# Patient Record
Sex: Male | Born: 1970 | Race: Black or African American | Hispanic: No | Smoking: Never smoker
Health system: Southern US, Community
[De-identification: ages and names within clinical notes are randomized; demographics above are authoritative.]

## PROBLEM LIST (undated history)

## (undated) DIAGNOSIS — I10 Essential (primary) hypertension: Secondary | ICD-10-CM

## (undated) DIAGNOSIS — R002 Palpitations: Principal | ICD-10-CM

## (undated) DIAGNOSIS — N189 Chronic kidney disease, unspecified: Secondary | ICD-10-CM

## (undated) DIAGNOSIS — E663 Overweight: Secondary | ICD-10-CM

## (undated) DIAGNOSIS — R7303 Prediabetes: Secondary | ICD-10-CM

## (undated) DIAGNOSIS — IMO0001 Reserved for inherently not codable concepts without codable children: Secondary | ICD-10-CM

## (undated) DIAGNOSIS — E785 Hyperlipidemia, unspecified: Secondary | ICD-10-CM

## (undated) DIAGNOSIS — K219 Gastro-esophageal reflux disease without esophagitis: Secondary | ICD-10-CM

## (undated) DIAGNOSIS — R7989 Other specified abnormal findings of blood chemistry: Secondary | ICD-10-CM

## (undated) DIAGNOSIS — R42 Dizziness and giddiness: Secondary | ICD-10-CM

## (undated) DIAGNOSIS — E7800 Pure hypercholesterolemia, unspecified: Secondary | ICD-10-CM

## (undated) DIAGNOSIS — F419 Anxiety disorder, unspecified: Secondary | ICD-10-CM

## (undated) DIAGNOSIS — D72819 Decreased white blood cell count, unspecified: Secondary | ICD-10-CM

## (undated) DIAGNOSIS — R030 Elevated blood-pressure reading, without diagnosis of hypertension: Secondary | ICD-10-CM

## (undated) DIAGNOSIS — R011 Cardiac murmur, unspecified: Secondary | ICD-10-CM

## (undated) DIAGNOSIS — I4891 Unspecified atrial fibrillation: Secondary | ICD-10-CM

## (undated) DIAGNOSIS — M1990 Unspecified osteoarthritis, unspecified site: Secondary | ICD-10-CM

## (undated) HISTORY — DX: Essential (primary) hypertension: I10

## (undated) HISTORY — DX: Reserved for inherently not codable concepts without codable children: IMO0001

## (undated) HISTORY — DX: Hyperlipidemia, unspecified: E78.5

## (undated) HISTORY — DX: Other specified abnormal findings of blood chemistry: R79.89

## (undated) HISTORY — DX: Elevated blood-pressure reading, without diagnosis of hypertension: R03.0

## (undated) HISTORY — DX: Pure hypercholesterolemia, unspecified: E78.00

## (undated) HISTORY — DX: Cardiac murmur, unspecified: R01.1

## (undated) HISTORY — DX: Morbid (severe) obesity due to excess calories: E66.01

## (undated) HISTORY — DX: Decreased white blood cell count, unspecified: D72.819

## (undated) HISTORY — DX: Overweight: E66.3

## (undated) HISTORY — DX: Palpitations: R00.2

---

## 1991-02-04 HISTORY — PX: WRIST SURGERY: SHX841

## 1992-02-04 HISTORY — PX: KNEE SURGERY: SHX244

## 1998-08-31 ENCOUNTER — Ambulatory Visit (HOSPITAL_COMMUNITY): Admission: RE | Admit: 1998-08-31 | Discharge: 1998-08-31 | Payer: Self-pay | Admitting: Emergency Medicine

## 2007-04-12 ENCOUNTER — Emergency Department (HOSPITAL_COMMUNITY): Admission: EM | Admit: 2007-04-12 | Discharge: 2007-04-12 | Payer: Self-pay | Admitting: Emergency Medicine

## 2007-08-09 ENCOUNTER — Encounter: Admission: RE | Admit: 2007-08-09 | Discharge: 2007-08-09 | Payer: Self-pay | Admitting: Family Medicine

## 2008-04-05 ENCOUNTER — Ambulatory Visit: Payer: Self-pay | Admitting: Diagnostic Radiology

## 2008-04-05 ENCOUNTER — Emergency Department (HOSPITAL_BASED_OUTPATIENT_CLINIC_OR_DEPARTMENT_OTHER): Admission: EM | Admit: 2008-04-05 | Discharge: 2008-04-05 | Payer: Self-pay | Admitting: Emergency Medicine

## 2008-04-28 ENCOUNTER — Emergency Department (HOSPITAL_BASED_OUTPATIENT_CLINIC_OR_DEPARTMENT_OTHER): Admission: EM | Admit: 2008-04-28 | Discharge: 2008-04-28 | Payer: Self-pay | Admitting: Emergency Medicine

## 2009-06-04 ENCOUNTER — Ambulatory Visit: Payer: Self-pay | Admitting: Internal Medicine

## 2009-06-06 LAB — COMPREHENSIVE METABOLIC PANEL
ALT: 38 U/L (ref 0–53)
AST: 23 U/L (ref 0–37)
Albumin: 4.3 g/dL (ref 3.5–5.2)
Alkaline Phosphatase: 68 U/L (ref 39–117)
BUN: 13 mg/dL (ref 6–23)
CO2: 26 mEq/L (ref 19–32)
Creatinine, Ser: 1.27 mg/dL (ref 0.40–1.50)
Glucose, Bld: 94 mg/dL (ref 70–99)

## 2009-06-06 LAB — LACTATE DEHYDROGENASE: LDH: 135 U/L (ref 94–250)

## 2009-06-06 LAB — CBC WITH DIFFERENTIAL/PLATELET
Basophils Absolute: 0 10*3/uL (ref 0.0–0.1)
EOS%: 3 % (ref 0.0–7.0)
Eosinophils Absolute: 0.1 10*3/uL (ref 0.0–0.5)
HGB: 14.4 g/dL (ref 13.0–17.1)
MONO#: 0.3 10*3/uL (ref 0.1–0.9)

## 2009-06-06 LAB — FOLATE: Folate: >20 ng/mL

## 2009-06-18 ENCOUNTER — Ambulatory Visit (HOSPITAL_COMMUNITY): Admission: RE | Admit: 2009-06-18 | Discharge: 2009-06-18 | Payer: Self-pay | Admitting: Internal Medicine

## 2009-06-21 LAB — CBC WITH DIFFERENTIAL/PLATELET
BASO%: 0.3 % (ref 0.0–2.0)
HCT: 43.9 % (ref 38.4–49.9)
HGB: 14.2 g/dL (ref 13.0–17.1)
MONO%: 8.3 % (ref 0.0–14.0)
NEUT#: 1.5 10*3/uL (ref 1.5–6.5)
Platelets: 234 10*3/uL (ref 140–400)

## 2009-06-22 LAB — HIV ANTIBODY (ROUTINE TESTING W REFLEX)

## 2009-06-22 LAB — HEPATITIS PANEL, ACUTE
HCV Ab: NEGATIVE
Hep A IgM: NEGATIVE
Hep B C IgM: NEGATIVE
Hepatitis B Surface Ag: NEGATIVE

## 2009-06-22 LAB — LACTATE DEHYDROGENASE: LDH: 138 U/L (ref 94–250)

## 2010-03-13 ENCOUNTER — Ambulatory Visit: Payer: Self-pay

## 2010-03-13 ENCOUNTER — Other Ambulatory Visit: Payer: Self-pay | Admitting: Occupational Medicine

## 2010-03-13 DIAGNOSIS — R52 Pain, unspecified: Secondary | ICD-10-CM

## 2010-04-22 LAB — CBC
MCV: 75.9 fL — ABNORMAL LOW (ref 78.0–100.0)
Platelets: 237 10*3/uL (ref 150–400)
RBC: 5.77 MIL/uL (ref 4.22–5.81)
RDW: 14.1 % (ref 11.5–15.5)
WBC: 3.8 10*3/uL — ABNORMAL LOW (ref 4.0–10.5)

## 2010-04-22 LAB — PROTIME-INR
INR: 1 (ref 0.00–1.49)
Prothrombin Time: 13.1 seconds (ref 11.6–15.2)

## 2010-04-22 LAB — BONE MARROW EXAM

## 2010-04-22 LAB — APTT: aPTT: 26 seconds (ref 24–37)

## 2010-04-22 LAB — CHROMOSOME ANALYSIS, BONE MARROW

## 2010-05-16 LAB — CBC
HCT: 45.9 % (ref 39.0–52.0)
Hemoglobin: 14.6 g/dL (ref 13.0–17.0)
MCHC: 31.7 g/dL (ref 30.0–36.0)
MCHC: 31.3 g/dL (ref 30.0–36.0)
MCV: 75.5 fL — ABNORMAL LOW (ref 78.0–100.0)
Platelets: 213 10*3/uL (ref 150–400)
RBC: 6.1 MIL/uL — ABNORMAL HIGH (ref 4.22–5.81)
RDW: 13.5 % (ref 11.5–15.5)

## 2010-05-16 LAB — URINALYSIS, ROUTINE W REFLEX MICROSCOPIC
Nitrite: NEGATIVE
Protein, ur: 100 mg/dL — AB
Specific Gravity, Urine: 1.021 (ref 1.005–1.030)
Urobilinogen, UA: 0.2 mg/dL (ref 0.0–1.0)

## 2010-05-16 LAB — DIFFERENTIAL
Basophils Absolute: 0 10*3/uL (ref 0.0–0.1)
Eosinophils Absolute: 0.1 10*3/uL (ref 0.0–0.7)
Eosinophils Relative: 2 % (ref 0–5)
Lymphs Abs: 1.3 10*3/uL (ref 0.7–4.0)
Lymphs Abs: 1.4 10*3/uL (ref 0.7–4.0)
Monocytes Relative: 9 % (ref 3–12)
Monocytes Relative: 7 % (ref 3–12)
Neutro Abs: 2 10*3/uL (ref 1.7–7.7)

## 2010-05-16 LAB — COMPREHENSIVE METABOLIC PANEL
AST: 46 U/L — ABNORMAL HIGH (ref 0–37)
Albumin: 4.1 g/dL (ref 3.5–5.2)
CO2: 28 mEq/L (ref 19–32)
Calcium: 9 mg/dL (ref 8.4–10.5)
Chloride: 102 mEq/L (ref 96–112)
GFR calc Af Amer: >60 mL/min (ref >60)
Glucose, Bld: 108 mg/dL — ABNORMAL HIGH (ref 70–99)
Potassium: 3.8 mEq/L (ref 3.5–5.1)
Sodium: 141 mEq/L (ref 135–145)
Total Protein: 7.7 g/dL (ref 6.0–8.3)

## 2010-05-16 LAB — BASIC METABOLIC PANEL
BUN: 13 mg/dL (ref 6–23)
Creatinine, Ser: 1.3 mg/dL (ref 0.4–1.5)
GFR calc non Af Amer: >60 mL/min (ref >60)
Glucose, Bld: 132 mg/dL — ABNORMAL HIGH (ref 70–99)
Potassium: 4 mEq/L (ref 3.5–5.1)
Sodium: 144 mEq/L (ref 135–145)

## 2010-05-16 LAB — GLUCOSE, CAPILLARY: Glucose-Capillary: 130 mg/dL — ABNORMAL HIGH (ref 70–99)

## 2010-05-16 LAB — POCT CARDIAC MARKERS: Myoglobin, poc: 107 ng/mL (ref 12–200)

## 2010-05-16 LAB — HEMOCCULT GUIAC POC 1CARD (OFFICE): Fecal Occult Bld: NEGATIVE

## 2010-05-16 LAB — LIPASE, BLOOD: Lipase: 102 U/L (ref 23–300)

## 2010-10-28 LAB — COMPREHENSIVE METABOLIC PANEL
AST: 28
CO2: 26
Chloride: 105
Creatinine, Ser: 1.23
GFR calc Af Amer: >60
GFR calc non Af Amer: >60
Total Bilirubin: 0.6

## 2010-10-28 LAB — CBC
HCT: 44.4
MCV: 74.5 — ABNORMAL LOW
RBC: 5.96 — ABNORMAL HIGH
WBC: 5

## 2010-10-28 LAB — DIFFERENTIAL
Basophils Absolute: 0.1
Basophils Relative: 2 — ABNORMAL HIGH
Eosinophils Absolute: 0.1
Eosinophils Relative: 1
Lymphocytes Relative: 31

## 2010-10-28 LAB — POCT CARDIAC MARKERS
Operator id: 5451
Troponin i, poc: <0.05

## 2011-05-11 IMAGING — CT CT BIOPSY
3 of 5 series · 17 of 32 positions shown, 22 images · non-contrast
Comparison: none

CLINICAL DATA: Leukocytopenia

CT GUIDED DEEP ILIAC BONE ASPIRATION AND CORE BIOPSY:
TECHNIQUE: Patient was placed supine on the CT gantry and limited
axial scans through the pelvis were obtained. Appropriate skin
entry site was identified. Skin site was marked, prepped with
Betadine,   draped in usual sterile fashion, and infiltrated
locally with 1% lidocaine. Intravenous fentanyl and Versed were
administered as conscious sedation during continuous
cardiorespiratory monitoring by the radiology RN, with a total
moderate sedation time of 15 minutes.

[Series 2: pelvis without · axial · non-contrast · 0.45mm/px · z∈[-401,-366]mm · 4 of 13 slices shown (1 of 2)]
[im 3/13  soft-tissue]
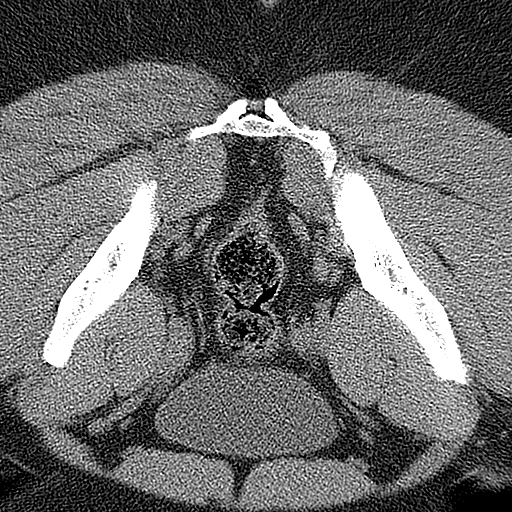
[im 5/13  soft-tissue]
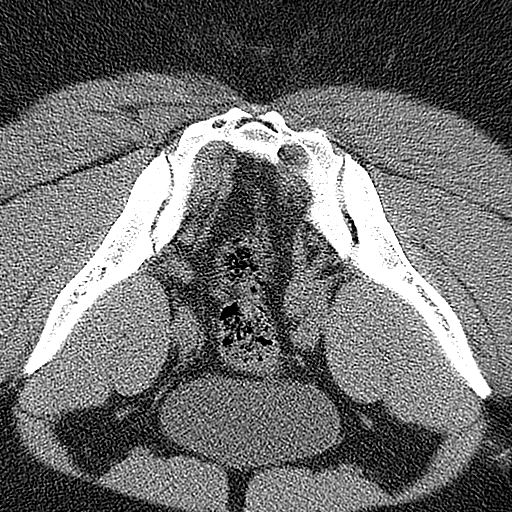
[im 8/13  soft-tissue]
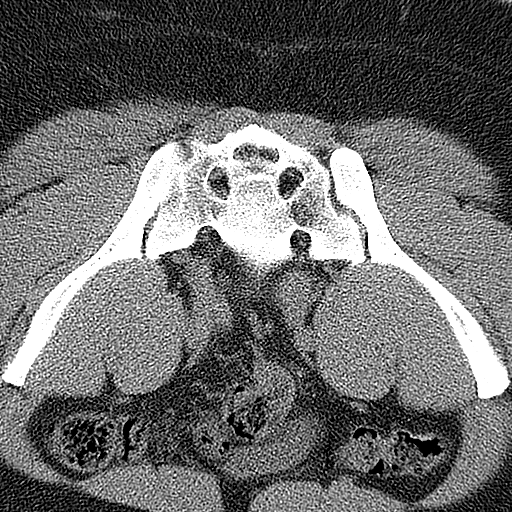
[im 10/13  soft-tissue]
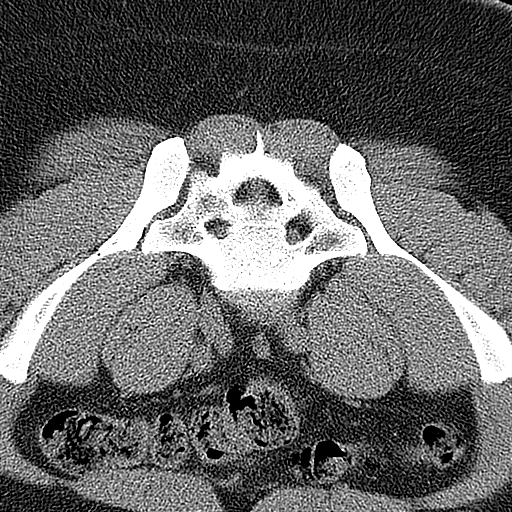

[Series 3: pelvis without · axial · non-contrast · 0.47mm/px · z∈[-401,-361]mm · 5 of 13 slices shown, 10 images (2 of 2)]
[im 3/13  soft-tissue]
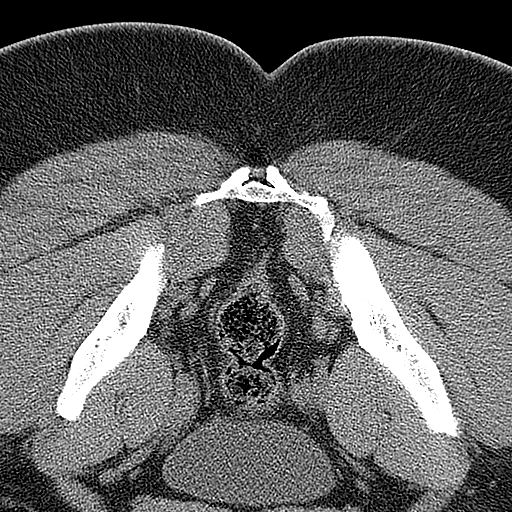
[im 3/13  bone]
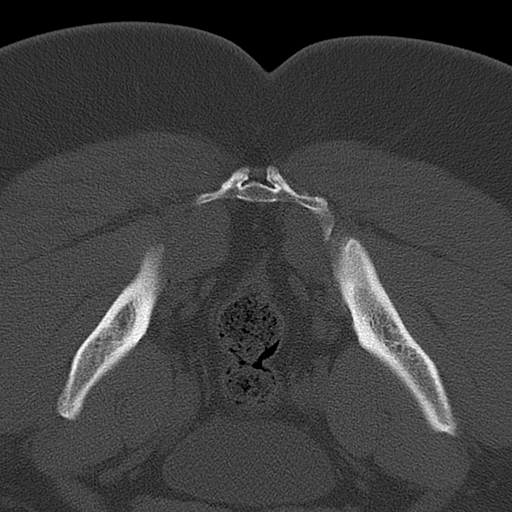
[im 5/13  soft-tissue]
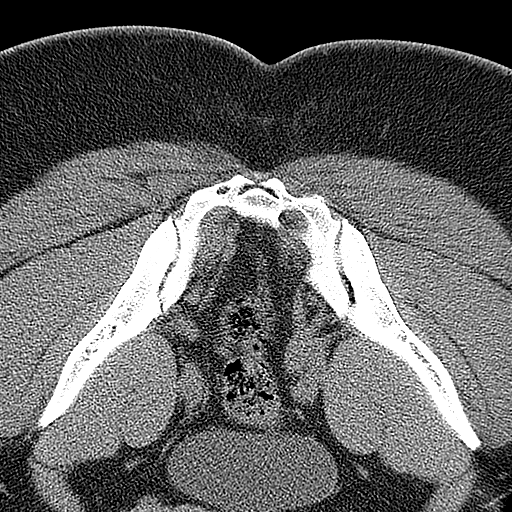
[im 5/13  lung]
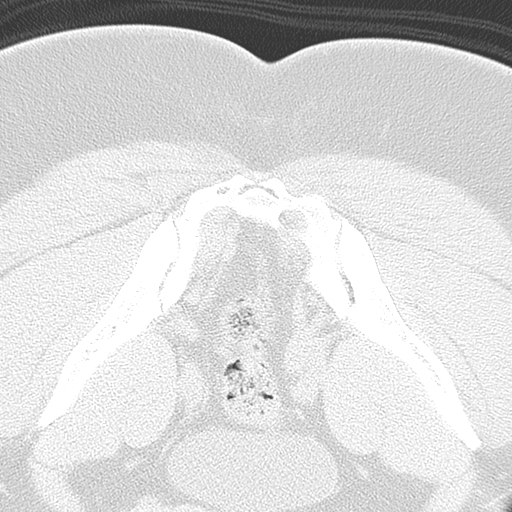
[im 7/13  soft-tissue]
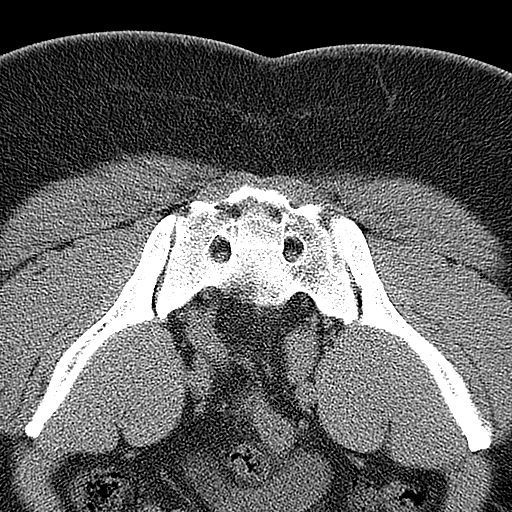
[im 7/13  lung]
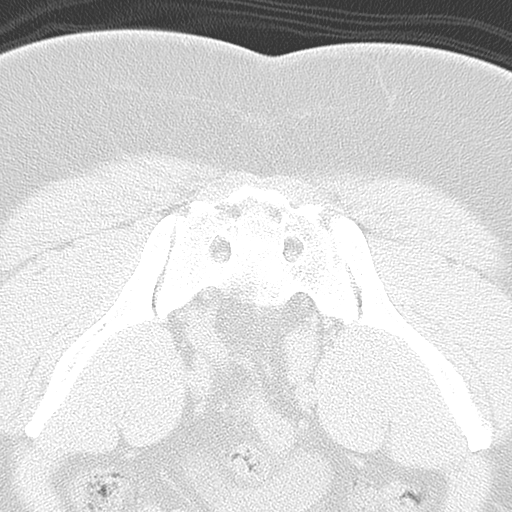
[im 9/13  soft-tissue]
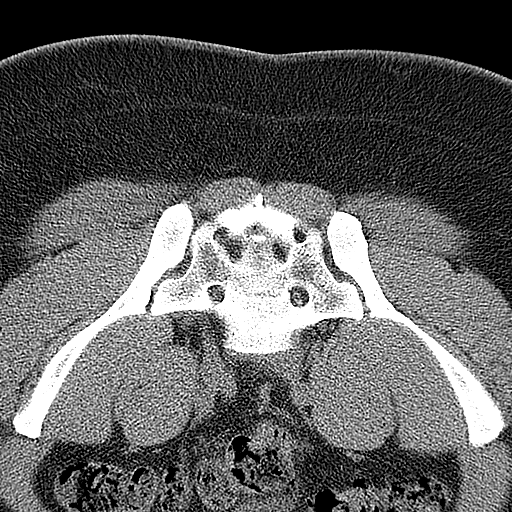
[im 9/13  lung]
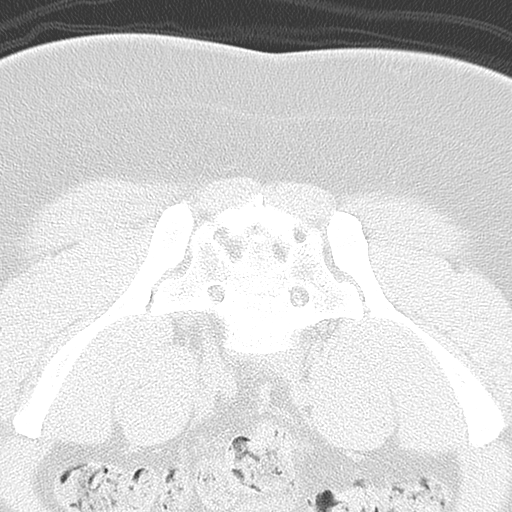
[im 11/13  soft-tissue]
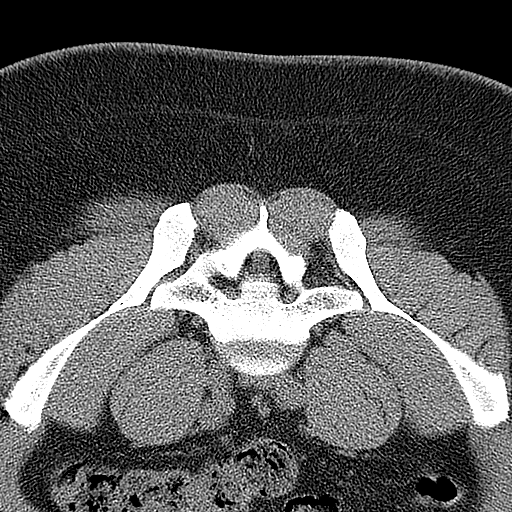
[im 11/13  lung]
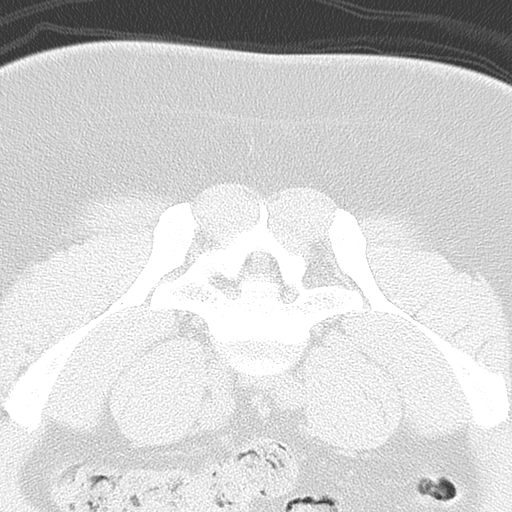

[Series 5: (hospital) 6.0 b30f · axial · 0.95mm/px · z∈[-366,-354]mm · 8 of 24 slices shown]
[im 2/24  soft-tissue]
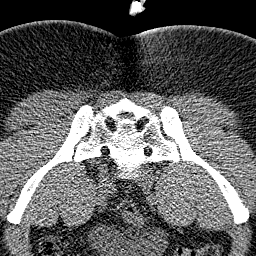
[im 6/24  soft-tissue]
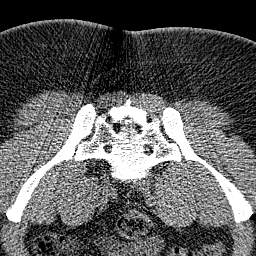
[im 8/24  soft-tissue]
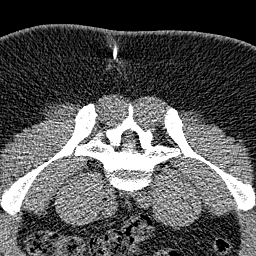
[im 10/24  soft-tissue]
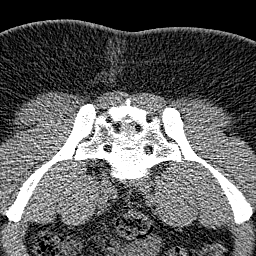
[im 14/24  soft-tissue]
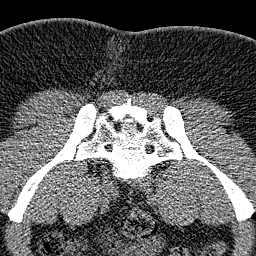
[im 16/24  soft-tissue]
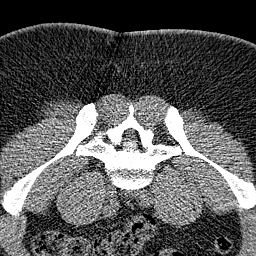
[im 18/24  soft-tissue]
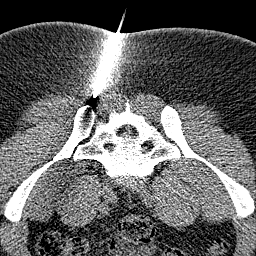
[im 22/24  soft-tissue]
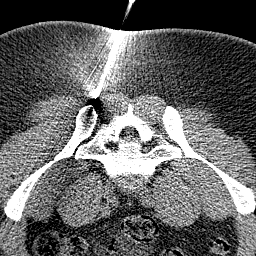

[17 of 32 positions shown; findings below may reference images not displayed]

Under CT fluoroscopic guidance an 11-gauge Cook trocar bone needle
was advanced into the right iliac bone just lateral to the
sacroiliac joint. Once needle tip position was confirmed, coaxial
core and aspiration samples were obtained. The final sample was
obtained using the guiding needle itself, which was then removed.
Postprocedure scans show no hematoma or fracture. Patient tolerated
procedure well, with no immediate complication.
IMPRESSION: 1. Technically successful CT guided right iliac bone core and
aspiration biopsy.

## 2015-03-21 ENCOUNTER — Encounter: Payer: Self-pay | Admitting: Interventional Cardiology

## 2015-03-22 ENCOUNTER — Ambulatory Visit: Payer: Self-pay | Admitting: Interventional Cardiology

## 2015-08-22 ENCOUNTER — Telehealth: Payer: Self-pay | Admitting: Cardiovascular Disease

## 2015-08-22 NOTE — Telephone Encounter (Signed)
Received records from [facility] for appointment on 09/24/15 with Dr [place].  Records given to N Hines (medical records) for Dr Lytton's schedule on 09/24/15. lp °

## 2015-09-23 NOTE — Progress Notes (Deleted)
Cardiology Office Note   Date:  09/23/2015   ID:  Robert Pingrayveawn Minckler, DOB [birth year], MRN [id number]  PCP:  No primary care provider on file.  Cardiologist:   Chilton Siiffany [place], MD   No chief complaint on file.     History of Present Illness: Robert Haney is a 45 y.o. male with hypertension and hyperlipidemia who presents for ***    Past Medical History:  Diagnosis Date  . Elevated BP    elevated BP readings-2010-------- normalized when rechecked 12/01/08  . Elevated LDL cholesterol level     (normal TSH 2008)  . Hypertension, essential   . Leukocytopenia    Mild leukocytopenia- work up including Bone Marrow bx done, normal variant- Dr. Penelope Galasmohammed, [place] [facility] 8/11  . Low testosterone    (symptomatic)--2.60 03/2008  . Overweight     Past Surgical History:  Procedure Laterality Date  . KNEE SURGERY  1994   right knee   . WRIST SURGERY  1993   left wrist      Current Outpatient Prescriptions  Medication Sig Dispense Refill  . amLODipine (NORVASC) 5 MG tablet Take 5 mg by mouth daily.    . DiphenhydrAMINE HCl (BENADRYL PO) Take by mouth. Benadryl 25 MG Capsule 1 capsule as needed    . Multiple Vitamin (MULTI-VITAMIN PO) Take by mouth. One Daily Mens Tablet 1 table t Once a day    . Ranitidine HCl (ZANTAC PO) Take by mouth. Zantac 150 MG Tablet 1 tablet every other day     No current facility-administered medications for this visit.     Allergies:   Review of patient's allergies indicates no known allergies.    Social History:  The patient  reports that he has never smoked. He does not have any smokeless tobacco history on file. He reports that he drinks alcohol. He reports that he does not use drugs.   Family History:  The patient's ***family history includes Hypertension in his father and mother.    ROS:  Please see the history of present illness.   Otherwise, review of systems are positive for {NONE DEFAULTED:18576::"none"}.   All other systems  are reviewed and negative.    PHYSICAL EXAM: VS:  There were no vitals taken for this visit. , BMI There is no height or weight on file to calculate BMI. GENERAL:  Well appearing HEENT:  Pupils equal round and reactive, fundi not visualized, oral mucosa unremarkable NECK:  No jugular venous distention, waveform within normal limits, carotid upstroke brisk and symmetric, no bruits, no thyromegaly LYMPHATICS:  No cervical adenopathy LUNGS:  Clear to auscultation bilaterally HEART:  RRR.  PMI not displaced or sustained,S1 and S2 within normal limits, no S3, no S4, no clicks, no rubs, *** murmurs ABD:  Flat, positive bowel sounds normal in frequency in pitch, no bruits, no rebound, no guarding, no midline pulsatile mass, no hepatomegaly, no splenomegaly EXT:  2 plus pulses throughout, no edema, no cyanosis no clubbing SKIN:  No rashes no nodules NEURO:  Cranial nerves II through XII grossly intact, motor grossly intact throughout PSYCH:  Cognitively intact, oriented to person place and time    EKG:  EKG {ACTION; IS/IS [id number]} ordered today. The ekg ordered today demonstrates ***   Recent Labs: No results found for requested labs within last 8760 hours.    Lipid Panel No results found for: CHOL, TRIG, HDL, CHOLHDL, VLDL, LDLCALC, LDLDIRECT    Wt Readings from Last 3 Encounters:  No data found for  Wt      ASSESSMENT AND PLAN:  ***   Current medicines are reviewed at length with the patient today.  The patient {ACTIONS; HAS/DOES NOT HAVE:19233} concerns regarding medicines.  The following changes have been made:  {PLAN; NO CHANGE:13088:s}  Labs/ tests ordered today include: *** No orders of the defined types were placed in this encounter.    Disposition:   FU with ***    This note was written with the assistance of speech recognition software.  Please excuse any transcriptional errors.  Signed, Toryn Dewalt C. Duke Salviaandolph, MD, [facility]  09/23/2015 9:43 PM    [facility]

## 2015-09-24 ENCOUNTER — Ambulatory Visit: Payer: Commercial Managed Care - HMO | Admitting: Cardiovascular Disease

## 2015-09-27 ENCOUNTER — Encounter: Payer: Self-pay | Admitting: Cardiovascular Disease

## 2015-09-27 ENCOUNTER — Ambulatory Visit (INDEPENDENT_AMBULATORY_CARE_PROVIDER_SITE_OTHER): Payer: Commercial Managed Care - HMO | Admitting: Cardiovascular Disease

## 2015-09-27 ENCOUNTER — Encounter (INDEPENDENT_AMBULATORY_CARE_PROVIDER_SITE_OTHER): Payer: Self-pay

## 2015-09-27 VITALS — BP 159/92 | HR 53 | Ht 62.5 in | Wt 234.4 lb

## 2015-09-27 DIAGNOSIS — E785 Hyperlipidemia, unspecified: Secondary | ICD-10-CM

## 2015-09-27 DIAGNOSIS — I10 Essential (primary) hypertension: Secondary | ICD-10-CM

## 2015-09-27 DIAGNOSIS — R011 Cardiac murmur, unspecified: Secondary | ICD-10-CM

## 2015-09-27 DIAGNOSIS — R002 Palpitations: Secondary | ICD-10-CM

## 2015-09-27 DIAGNOSIS — R600 Localized edema: Secondary | ICD-10-CM

## 2015-09-27 HISTORY — DX: Hyperlipidemia, unspecified: E78.5

## 2015-09-27 HISTORY — DX: Palpitations: R00.2

## 2015-09-27 HISTORY — DX: Cardiac murmur, unspecified: R01.1

## 2015-09-27 HISTORY — DX: Morbid (severe) obesity due to excess calories: E66.01

## 2015-09-27 HISTORY — DX: Essential (primary) hypertension: I10

## 2015-09-27 NOTE — Progress Notes (Signed)
Cardiology Office Note   Date:  09/27/2015   ID:  Robert Haney, DOB 1970/04/17, MRN [id number]  PCP:  Darrow BussingKOIRALA,DIBAS, MD  Cardiologist:   Chilton Siiffany [place], MD   Chief Complaint  Patient presents with  . New Patient (Initial Visit)    chest pain; burning sensation. dizziness; randomly. edema; in legs and ankles. General Check up.       History of Present Illness: Robert Haney is a 45 y.o. male with OSA on CPAP, hypertension and hyperlipidemia who presents for an evaluation of palpitations.  Robert Haney saw Dr. Darrow Bussingibas Koirala on 08/20/15.  At that appointment he noted palpitations.  He was referred to cardiology for evaluation.  For over a year he has noted episodes of irregular heart rates.  He feels as though his heart is beating irregularly fourth 2-5 minutes. It also makes him feel like he needs to cough. There is no associated shortness of breath or chest pain.  He notices it more frequently when driving, laying down or in stressful situations.  He does not notice it with exertion.  These episodes occur at least once per month.  He endorses heart burn but no exertional chest pain.  He notes that he was told he had a heart murmur as a teen.  He endorses lower extremity edema and varicose veins.  He is unsure of whether it improves with elevation of his legs.    Robert Haney notes that his diet is poor.  Lately he has been trying to limit his intake of bread, chips and fried foods.  He exercises by walking or jogging 2-3 times per week.  He struggles most with carbohydrates.   Past Medical History:  Diagnosis Date  . Elevated BP    elevated BP readings-2010-------- normalized when rechecked 12/01/08  . Elevated LDL cholesterol level     (normal TSH 2008)  . Essential hypertension 09/27/2015  . Hyperlipidemia 09/27/2015  . Hypertension, essential   . Leukocytopenia    Mild leukocytopenia- work up including Bone Marrow bx done, normal variant- Dr. Penelope Galasmohammed, [place]  [facility] 8/11  . Low testosterone    (symptomatic)--2.60 03/2008  . Morbid obesity (HCC) 09/27/2015  . Murmur 09/27/2015  . Overweight   . Palpitations 09/27/2015    Past Surgical History:  Procedure Laterality Date  . KNEE SURGERY  1994   right knee   . WRIST SURGERY  1993   left wrist      Current Outpatient Prescriptions  Medication Sig Dispense Refill  . amLODipine (NORVASC) 5 MG tablet Take 5 mg by mouth daily.    . cyclobenzaprine (FLEXERIL) 10 MG tablet TK 1 T PO TID PRN  0  . DiphenhydrAMINE HCl (BENADRYL PO) Take by mouth. Benadryl 25 MG Capsule 1 capsule as needed    . ibuprofen (ADVIL,MOTRIN) 200 MG tablet Take 200 mg by mouth every 6 (six) hours as needed.    . Multiple Vitamin (MULTI-VITAMIN PO) Take by mouth. One Daily Mens Tablet 1 table t Once a day    . Ranitidine HCl (ZANTAC PO) Take by mouth. Zantac 150 MG Tablet 1 tablet every other day    . vitamin B-12 (CYANOCOBALAMIN) 250 MCG tablet Take 250 mcg by mouth daily.     No current facility-administered medications for this visit.     Allergies:   Review of patient's allergies indicates no known allergies.    Social History:  The patient  reports that he has never smoked. He does not have any  smokeless tobacco history on file. He reports that he drinks alcohol. He reports that he does not use drugs.   Family History:  The patient's family history includes Heart attack in his father; Hypertension in his father and mother; Stroke in his maternal grandmother.    ROS:  Please see the history of present illness.   Otherwise, review of systems are positive for none.   All other systems are reviewed and negative.    PHYSICAL EXAM: VS:  BP (!) 159/92   Pulse (!) 53   Ht 5' 2.5" (1.588 m)   Wt 234 lb 6.4 oz (106.3 kg)   BMI 42.19 kg/m  , BMI Body mass index is 42.19 kg/m. GENERAL:  Well appearing HEENT:  Pupils equal round and reactive, fundi not visualized, oral mucosa unremarkable NECK:  No jugular venous  distention, waveform within normal limits, carotid upstroke brisk and symmetric, no bruits, no thyromegaly LYMPHATICS:  No cervical adenopathy LUNGS:  Clear to auscultation bilaterally HEART:  RRR.  PMI not displaced or sustained,S1 and S2 within normal limits, no S3, no S4, no clicks, no rubs, I/VI systolic murmur at the LUSB ABD:  Flat, positive bowel sounds normal in frequency in pitch, no bruits, no rebound, no guarding, no midline pulsatile mass, no hepatomegaly, no splenomegaly EXT:  2 plus pulses throughout, no edema, no cyanosis no clubbing SKIN:  No rashes no nodules NEURO:  Cranial nerves II through XII grossly intact, motor grossly intact throughout PSYCH:  Cognitively intact, oriented to person place and time    EKG:  EKG is ordered today. The ekg ordered today demonstrates sinus bradycardia rate 53 bpm.   Recent Labs: No results found for requested labs within last 8760 hours.   08/20/15: TSH 1.59 Total cholesterol 204, tri 134, HDL 40, LDL 137  Lipid Panel No results found for: CHOL, TRIG, HDL, CHOLHDL, VLDL, LDLCALC, LDLDIRECT    Wt Readings from Last 3 Encounters:  09/27/15 234 lb 6.4 oz (106.3 kg)      ASSESSMENT AND PLAN:  # Palpitations: Ms. Haney reports palpitations that Occur at least once per month. His EKG today shows sinus bradycardia but no arrhythmias. We will obtain a 30 day event monitor to better assess his heart rhythm. His artery had the appropriate laboratory testing and it has been unremarkable.  # LE Edema: # Murmur:  Robert Haney reports having lower extremity edema and has a mild systolic murmur on exam. We will obtain an echo to better evaluate.  # Hypertension: Blood pressure is above goal today. On repeat it was 148/82. He reports that it is typically well-controlled. He works as a IT sales professional. I've asked him to check his blood pressure at work several times and bring these results to his next appointment. For now, continue amlodipine 5  mg daily.  # Hyperlipidemia: ASCVD 10 year risk is 12.0%.  Will discuss statin at his follow up appointment.  # Morbid obesity: BMI 42.  We discussed the importance of dietary interventions. He expressed understanding.  Current medicines are reviewed at length with the patient today.  The patient does not have concerns regarding medicines.  The following changes have been made:  no change  Labs/ tests ordered today include:   Orders Placed This Encounter  Procedures  . Cardiac event monitor  . EKG 12-Lead  . ECHOCARDIOGRAM COMPLETE     Disposition:   FU with Maiyah Goyne C. Duke Salvia, MD, [facility]  In 6 weeks.     This note was written  with the assistance of speech recognition software.  Please excuse any transcriptional errors.  Signed, Haedyn Breau C. Duke Salviaandolph, MD, [facility]  09/27/2015 10:15 AM    [facility]

## 2015-09-27 NOTE — Patient Instructions (Addendum)
Medication Instructions:  Your physician recommends that you continue on your current medications as directed. Please refer to the Current Medication list given to you today.   Testing/Procedures: Your physician has recommended that you wear an event monitor. Event monitors are medical devices that record the heart's electrical activity. Doctors most often us these monitors to diagnose arrhythmias. Arrhythmias are problems with the speed or rhythm of the heartbeat. The monitor is a small, portable device. You can wear one while you do your normal daily activities. This is usually used to diagnose what is causing palpitations/syncope (passing out). 30 DAY EVENT.  Your physician has requested that you have an echocardiogram. Echocardiography is a painless test that uses sound waves to create images of your heart. It provides your doctor with information about the size and shape of your heart and how well your heart's chambers and valves are working. This procedure takes approximately one hour. There are no restrictions for this procedure.    Follow-Up: Your physician recommends that you schedule a follow-up appointment in: 6 WEEKS WITH DR [place].   Any Other Special Instructions Will Be Listed Below (If Applicable).  Echocardiogram An echocardiogram, or echocardiography, uses sound waves (ultrasound) to produce an image of your heart. The echocardiogram is simple, painless, obtained within a short period of time, and offers valuable information to your health care provider. The images from an echocardiogram can provide information such as:  Evidence of coronary artery disease (CAD).  Heart size.  Heart muscle function.  Heart valve function.  Aneurysm detection.  Evidence of a past heart attack.  Fluid buildup around the heart.  Heart muscle thickening.  Assess heart valve function. LET [facility] PROVID[facility] KNOW ABOUT:  Any allergies you have.  All medicines you are  taking, including vitamins, herbs, eye drops, creams, and over-the-counter medicines.  Previous problems you or members of your family have had with the use of anesthetics.  Any blood disorders you have.  Previous surgeries you have had.  Medical conditions you have.  Possibility of pregnancy, if this applies. BEFORE THE PROCEDURE  No special preparation is needed. Eat and drink normally.  PROCEDURE   In order to produce an image of your heart, gel will be applied to your chest and a wand-like tool (transducer) will be moved over your chest. The gel will help transmit the sound waves from the transducer. The sound waves will harmlessly bounce off your heart to allow the heart images to be captured in real-time motion. These images will then be recorded.  You may need an IV to receive a medicine that improves the quality of the pictures. AFTER THE PROCEDURE You may return to your normal schedule including diet, activities, and medicines, unless your health care provider tells you otherwise.   This information is not intended to replace advice given to you by your health care provider. Make sure you discuss any questions you have with your health care provider.   Document Released: 01/18/2000 Document Revised: 02/10/2014 Document Reviewed: 09/27/2012 Elsevier Interactive Patient Education [employer]2016 Elsevier Inc.   If you need a refill on your cardiac medications before your next appointment, please call your pharmacy.

## 2015-10-02 ENCOUNTER — Encounter: Payer: Self-pay | Admitting: Cardiovascular Disease

## 2015-10-03 ENCOUNTER — Ambulatory Visit (INDEPENDENT_AMBULATORY_CARE_PROVIDER_SITE_OTHER): Payer: Commercial Managed Care - HMO

## 2015-10-03 DIAGNOSIS — R002 Palpitations: Secondary | ICD-10-CM | POA: Diagnosis not present

## 2015-10-03 DIAGNOSIS — R011 Cardiac murmur, unspecified: Secondary | ICD-10-CM | POA: Diagnosis not present

## 2015-10-03 DIAGNOSIS — R600 Localized edema: Secondary | ICD-10-CM

## 2015-10-04 ENCOUNTER — Encounter: Payer: Self-pay | Admitting: Cardiovascular Disease

## 2015-10-04 ENCOUNTER — Telehealth: Payer: Self-pay | Admitting: Cardiovascular Disease

## 2015-10-09 ENCOUNTER — Encounter (INDEPENDENT_AMBULATORY_CARE_PROVIDER_SITE_OTHER): Payer: Self-pay

## 2015-10-09 ENCOUNTER — Ambulatory Visit (HOSPITAL_COMMUNITY): Payer: Commercial Managed Care - HMO | Attending: Cardiovascular Disease

## 2015-10-09 ENCOUNTER — Other Ambulatory Visit: Payer: Self-pay

## 2015-10-09 DIAGNOSIS — R011 Cardiac murmur, unspecified: Secondary | ICD-10-CM | POA: Diagnosis not present

## 2015-10-09 DIAGNOSIS — I119 Hypertensive heart disease without heart failure: Secondary | ICD-10-CM | POA: Insufficient documentation

## 2015-10-09 DIAGNOSIS — R600 Localized edema: Secondary | ICD-10-CM

## 2015-10-09 DIAGNOSIS — Z8249 Family history of ischemic heart disease and other diseases of the circulatory system: Secondary | ICD-10-CM | POA: Diagnosis not present

## 2015-10-09 DIAGNOSIS — E785 Hyperlipidemia, unspecified: Secondary | ICD-10-CM | POA: Insufficient documentation

## 2015-10-09 DIAGNOSIS — R002 Palpitations: Secondary | ICD-10-CM | POA: Insufficient documentation

## 2015-11-13 ENCOUNTER — Ambulatory Visit: Payer: Self-pay | Admitting: Cardiovascular Disease

## 2015-11-13 NOTE — Progress Notes (Signed)
Cardiology Office Note   Date:  11/14/2015   ID:  Robert Haney, DOB [birth year], MRN [id number]  PCP:  Robert Haney,DIBAS, MD  Cardiologist:   Robert Siiffany [place], MD   Chief Complaint  Patient presents with  . Follow-up    pt states some light headedness or dizziness   . Chest Pain    some chest pain the other day but after taking antacid pill it went away       History of Present Illness: Robert Haney is a 45 y.o. male with OSA on CPAP, PVCs, hypertension and hyperlipidemia who presents for follow up.  [name] saw Dr. Darrow Bussingibas Koirala on 08/2015 and noted palpitations.  He had a 30 day event montor 09/2015 that revealed occsional PVCs.  At his clinic appointment 09/2015 he was noted to have a murmur on exam and was referred for an echo that showed LVEF 60-65% with mild LVH and no intracavitary gradient or valvular diseae.  His blood pressure was poorly-controlled.  He was asked to make lifestyle and dietary changes.  Since his last appointment [name] has been feeling well.  He reports a few episodes of right-sided chest pain that occur after eating late in the evening. He denies any exertional chest pain or shortness of breath. He has not needed any lifestyle changes. He has been trying to walk a little bit more but has not made any dietary changes. He notes that he feels tired when waking up in the mornings.  He is only able to sleep 6 hours each night despite what time he goes to sleep. He has not been drinking a lot of caffeine lately in the evenings but does not go to sleep in a consistent time.  He reports compliance with his CPAP machine.  He checked his blood pressure a few times since last appointment and notes that it typically has been running slightly above goal. He continues to have palpitations a few times per week.   Past Medical History:  Diagnosis Date  . Elevated BP    elevated BP readings-2010-------- normalized when rechecked 12/01/08  . Elevated LDL  cholesterol level     (normal TSH 2008)  . Essential hypertension 09/27/2015  . Hyperlipidemia 09/27/2015  . Hypertension, essential   . Leukocytopenia    Mild leukocytopenia- work up including Bone Marrow bx done, normal variant- Dr. Penelope Haney, [place] 8/11  . Low testosterone    (symptomatic)--2.60 03/2008  . Morbid obesity (HCC) 09/27/2015  . Murmur 09/27/2015  . Overweight   . Palpitations 09/27/2015    Past Surgical History:  Procedure Laterality Date  . KNEE SURGERY  1994   right knee   . WRIST SURGERY  1993   left wrist      Current Outpatient Prescriptions  Medication Sig Dispense Refill  . amLODipine (NORVASC) 5 MG tablet 1 AND 1/2 TABLET BY MOUTH DAILY 45 tablet 5  . cyclobenzaprine (FLEXERIL) 10 MG tablet TK 1 T PO TID PRN  0  . DiphenhydrAMINE HCl (BENADRYL PO) Take by mouth. Benadryl 25 MG Capsule 1 capsule as needed    . ibuprofen (ADVIL,MOTRIN) 200 MG tablet Take 200 mg by mouth every 6 (six) hours as needed.    . Multiple Vitamin (MULTI-VITAMIN PO) Take by mouth. One Daily Mens Tablet 1 table t Once a day    . Ranitidine HCl (ZANTAC PO) Take by mouth. Zantac 150 MG Tablet 1 tablet every other day    . vitamin B-12 (CYANOCOBALAMIN) 250  MCG tablet Take 250 mcg by mouth daily.     No current facility-administered medications for this visit.     Allergies:   Review of patient's allergies indicates no known allergies.    Social History:  The patient  reports that he has never smoked. He has never used smokeless tobacco. He reports that he drinks alcohol. He reports that he does not use drugs.   Family History:  The patient's family history includes Heart attack in his father; Hypertension in his father and mother; Stroke in his maternal grandmother.    ROS:  Please see the history of present illness.   Otherwise, review of systems are positive for none.   All other systems are reviewed and negative.    PHYSICAL EXAM: VS:  BP (!) 135/92   Pulse 68    Ht 5' 2.5" (1.588 m)   Wt 235 lb 3.2 oz (106.7 kg)   SpO2 95%   BMI 42.33 kg/m  , BMI Body mass index is 42.33 kg/m. GENERAL:  Well appearing HEENT:  Pupils equal round and reactive, fundi not visualized, oral mucosa unremarkable NECK:  No jugular venous distention, waveform within normal limits, carotid upstroke brisk and symmetric, no bruits LYMPHATICS:  No cervical adenopathy LUNGS:  Clear to auscultation bilaterally HEART:  RRR.  PMI not displaced or sustained,S1 and S2 within normal limits, no S3, no S4, no clicks, no rubs, I/VI systolic murmur at the LUSB ABD:  Flat, positive bowel sounds normal in frequency in pitch, no bruits, no rebound, no guarding, no midline pulsatile mass, no hepatomegaly, no splenomegaly EXT:  2 plus pulses throughout, trace  edema, no cyanosis no clubbing SKIN:  No rashes no nodules NEURO:  Cranial nerves II through XII grossly intact, motor grossly intact throughout PSYCH:  Cognitively intact, oriented to person place and time  EKG:  EKG is not ordered today. The ekg ordered 09/27/15 demonstrates sinus bradycardia rate 53 bpm.  Echo 10/09/15: Study Conclusions  - Left ventricle: The cavity size was normal. Wall thickness was   increased in a pattern of mild LVH. Systolic function was normal.   The estimated ejection fraction was in the range of 60% to 65%.   Wall motion was normal; there were no regional wall motion   abnormalities. Left ventricular diastolic function parameters   were normal.  30 Day Event Monitor 10/03/15: Quality: Fair.  Baseline artifact. Predominant rhythm: sinus rhythm, sinus bradycardia.   Max heart rate: 110 bpm Min heart rate:40 bpm  Occasional PVCs  Recent Labs: No results found for requested labs within last 8760 hours.   08/20/15: TSH 1.59 Total cholesterol 204, tri 134, HDL 40, LDL 137  Lipid Panel No results found for: CHOL, TRIG, HDL, CHOLHDL, VLDL, LDLCALC, LDLDIRECT    Wt Readings from Last 3  Encounters:  11/14/15 235 lb 3.2 oz (106.7 kg)  09/27/15 234 lb 6.4 oz (106.3 kg)      ASSESSMENT AND PLAN:  # PVCs: Robert Haney was noted to have very occasional PVCs on his event monitor.  Thyroid function and electrolytes are normal.  He was reassured that these are not dangerous.  We discussed trying a beta blocker and he is not interested.   # LE Edema: Echo was unremarkable.  He wears a compression sleeve which helps.   # Hypertension: Blood pressure remains slightly above goal. We will increase amlodipine to 7.5 mg daily.  # Hyperlipidemia: ASCVD 10 year risk is 9.0%. We discussed the importance of regular  exercise and making improvements in his diet. He was understanding and we will repeat his lipids in 3 months. They remain elevated at that time he will need to start on aspirin and a statin   # Morbid obesity: BMI 42.  We discussed the importance of dietary interventions. He expressed understanding.  Current medicines are reviewed at length with the patient today.  The patient does not have concerns regarding medicines.  The following changes have been made:  no change  Labs/ tests ordered today include:   Orders Placed This Encounter  Procedures  . Comprehensive metabolic panel  . Lipid panel     Disposition:   FU with Jeda Pardue C. Duke Salvia, MD, [facility] in 3 months.    This note was written with the assistance of speech recognition software.  Please excuse any transcriptional errors.  Signed, Kiffany Schelling C. Duke Salvia, MD, [facility]  11/14/2015 9:51 AM    [facility]

## 2015-11-14 ENCOUNTER — Ambulatory Visit (INDEPENDENT_AMBULATORY_CARE_PROVIDER_SITE_OTHER): Payer: Commercial Managed Care - HMO | Admitting: Cardiovascular Disease

## 2015-11-14 ENCOUNTER — Encounter: Payer: Self-pay | Admitting: Cardiovascular Disease

## 2015-11-14 VITALS — BP 135/92 | HR 68 | Ht 62.5 in | Wt 235.2 lb

## 2015-11-14 DIAGNOSIS — I10 Essential (primary) hypertension: Secondary | ICD-10-CM | POA: Diagnosis not present

## 2015-11-14 DIAGNOSIS — E785 Hyperlipidemia, unspecified: Secondary | ICD-10-CM

## 2015-11-14 MED ORDER — AMLODIPINE BESYLATE 5 MG PO TABS: ORAL_TABLET | ORAL | 5 refills | Status: DC

## 2015-11-14 NOTE — Patient Instructions (Signed)
Medication Instructions:  INCREASE YOUR AMLODIPINE TO 7.5 MG (5 MG 1 AND 1/2 TABLET) DAILY  Labwork: FASTING LP/CMET AT [facility] ON FIRST FLOOR FEW DAYS BEFORE FOLLOW UP   Testing/Procedures: NONE  Follow-Up: Your physician recommends that you schedule a follow-up appointment in: 3 MONTH OV   If you need a refill on your cardiac medications before your next appointment, please call your pharmacy.

## 2015-12-05 NOTE — Telephone Encounter (Signed)
Closed encounter °

## 2016-02-18 DIAGNOSIS — E7800 Pure hypercholesterolemia, unspecified: Secondary | ICD-10-CM | POA: Diagnosis not present

## 2016-03-03 ENCOUNTER — Ambulatory Visit: Payer: Self-pay | Admitting: Cardiovascular Disease

## 2016-04-25 DIAGNOSIS — G4733 Obstructive sleep apnea (adult) (pediatric): Secondary | ICD-10-CM | POA: Diagnosis not present

## 2016-04-28 ENCOUNTER — Other Ambulatory Visit: Payer: Self-pay | Admitting: Cardiovascular Disease

## 2016-04-28 NOTE — Telephone Encounter (Signed)
Please review for refill. Thanks!  

## 2016-08-11 DIAGNOSIS — G4733 Obstructive sleep apnea (adult) (pediatric): Secondary | ICD-10-CM | POA: Diagnosis not present

## 2016-08-11 DIAGNOSIS — J300 Vasomotor rhinitis: Secondary | ICD-10-CM | POA: Diagnosis not present

## 2016-09-15 DIAGNOSIS — M84374A Stress fracture, right foot, initial encounter for fracture: Secondary | ICD-10-CM | POA: Diagnosis not present

## 2016-09-15 DIAGNOSIS — M7672 Peroneal tendinitis, left leg: Secondary | ICD-10-CM | POA: Diagnosis not present

## 2016-09-22 DIAGNOSIS — M84374D Stress fracture, right foot, subsequent encounter for fracture with routine healing: Secondary | ICD-10-CM | POA: Diagnosis not present

## 2016-10-07 DIAGNOSIS — M84375D Stress fracture, left foot, subsequent encounter for fracture with routine healing: Secondary | ICD-10-CM | POA: Diagnosis not present

## 2016-10-21 DIAGNOSIS — M84374D Stress fracture, right foot, subsequent encounter for fracture with routine healing: Secondary | ICD-10-CM | POA: Diagnosis not present

## 2016-11-07 ENCOUNTER — Other Ambulatory Visit: Payer: Self-pay | Admitting: Cardiovascular Disease

## 2016-11-07 NOTE — Telephone Encounter (Signed)
Rx(s) sent to pharmacy electronically.  

## 2016-11-07 NOTE — Telephone Encounter (Signed)
Please review for refill, Thanks !  

## 2016-11-11 DIAGNOSIS — R7301 Impaired fasting glucose: Secondary | ICD-10-CM | POA: Diagnosis not present

## 2016-11-11 DIAGNOSIS — Z0000 Encounter for general adult medical examination without abnormal findings: Secondary | ICD-10-CM | POA: Diagnosis not present

## 2016-11-11 DIAGNOSIS — I10 Essential (primary) hypertension: Secondary | ICD-10-CM | POA: Diagnosis not present

## 2016-11-14 DIAGNOSIS — Z0000 Encounter for general adult medical examination without abnormal findings: Secondary | ICD-10-CM | POA: Diagnosis not present

## 2017-02-10 DIAGNOSIS — R42 Dizziness and giddiness: Secondary | ICD-10-CM | POA: Diagnosis not present

## 2017-02-27 DIAGNOSIS — G4733 Obstructive sleep apnea (adult) (pediatric): Secondary | ICD-10-CM | POA: Diagnosis not present

## 2017-04-11 DIAGNOSIS — J208 Acute bronchitis due to other specified organisms: Secondary | ICD-10-CM | POA: Diagnosis not present

## 2017-06-09 DIAGNOSIS — G4733 Obstructive sleep apnea (adult) (pediatric): Secondary | ICD-10-CM | POA: Diagnosis not present

## 2017-09-14 DIAGNOSIS — M170 Bilateral primary osteoarthritis of knee: Secondary | ICD-10-CM | POA: Diagnosis not present

## 2017-10-13 DIAGNOSIS — M1711 Unilateral primary osteoarthritis, right knee: Secondary | ICD-10-CM | POA: Diagnosis not present

## 2017-10-20 DIAGNOSIS — M1711 Unilateral primary osteoarthritis, right knee: Secondary | ICD-10-CM | POA: Diagnosis not present

## 2017-10-27 DIAGNOSIS — M1711 Unilateral primary osteoarthritis, right knee: Secondary | ICD-10-CM | POA: Diagnosis not present

## 2017-12-25 DIAGNOSIS — I10 Essential (primary) hypertension: Secondary | ICD-10-CM | POA: Diagnosis not present

## 2017-12-25 DIAGNOSIS — R7301 Impaired fasting glucose: Secondary | ICD-10-CM | POA: Diagnosis not present

## 2017-12-25 DIAGNOSIS — Z0000 Encounter for general adult medical examination without abnormal findings: Secondary | ICD-10-CM | POA: Diagnosis not present

## 2017-12-25 DIAGNOSIS — E7800 Pure hypercholesterolemia, unspecified: Secondary | ICD-10-CM | POA: Diagnosis not present

## 2018-01-07 DIAGNOSIS — Z0000 Encounter for general adult medical examination without abnormal findings: Secondary | ICD-10-CM | POA: Diagnosis not present

## 2018-01-25 DIAGNOSIS — Z79899 Other long term (current) drug therapy: Secondary | ICD-10-CM | POA: Diagnosis not present

## 2018-03-03 DIAGNOSIS — G4733 Obstructive sleep apnea (adult) (pediatric): Secondary | ICD-10-CM | POA: Diagnosis not present

## 2018-03-22 DIAGNOSIS — Z79899 Other long term (current) drug therapy: Secondary | ICD-10-CM | POA: Diagnosis not present

## 2018-04-22 DIAGNOSIS — I10 Essential (primary) hypertension: Secondary | ICD-10-CM | POA: Diagnosis not present

## 2018-04-22 DIAGNOSIS — R809 Proteinuria, unspecified: Secondary | ICD-10-CM | POA: Diagnosis not present

## 2018-05-06 DIAGNOSIS — I10 Essential (primary) hypertension: Secondary | ICD-10-CM | POA: Diagnosis not present

## 2018-05-20 DIAGNOSIS — R809 Proteinuria, unspecified: Secondary | ICD-10-CM | POA: Diagnosis not present

## 2018-05-20 DIAGNOSIS — N183 Chronic kidney disease, stage 3 (moderate): Secondary | ICD-10-CM | POA: Diagnosis not present

## 2018-05-24 ENCOUNTER — Other Ambulatory Visit: Payer: Self-pay | Admitting: Nephrology

## 2018-05-24 DIAGNOSIS — N183 Chronic kidney disease, stage 3 unspecified: Secondary | ICD-10-CM

## 2018-06-08 DIAGNOSIS — B356 Tinea cruris: Secondary | ICD-10-CM | POA: Diagnosis not present

## 2018-06-08 DIAGNOSIS — I10 Essential (primary) hypertension: Secondary | ICD-10-CM | POA: Diagnosis not present

## 2018-06-14 DIAGNOSIS — N183 Chronic kidney disease, stage 3 (moderate): Secondary | ICD-10-CM | POA: Diagnosis not present

## 2018-06-18 ENCOUNTER — Other Ambulatory Visit: Payer: Self-pay

## 2018-06-18 ENCOUNTER — Ambulatory Visit
Admission: RE | Admit: 2018-06-18 | Discharge: 2018-06-18 | Disposition: A | Discharge status: Home / Self Care | Payer: Commercial Managed Care - HMO | Source: Ambulatory Visit | Attending: Nephrology | Admitting: Nephrology

## 2018-06-18 DIAGNOSIS — N183 Chronic kidney disease, stage 3 unspecified: Secondary | ICD-10-CM

## 2018-06-25 DIAGNOSIS — N183 Chronic kidney disease, stage 3 (moderate): Secondary | ICD-10-CM | POA: Diagnosis not present

## 2018-06-25 DIAGNOSIS — I129 Hypertensive chronic kidney disease with stage 1 through stage 4 chronic kidney disease, or unspecified chronic kidney disease: Secondary | ICD-10-CM | POA: Diagnosis not present

## 2018-06-25 DIAGNOSIS — R809 Proteinuria, unspecified: Secondary | ICD-10-CM | POA: Diagnosis not present

## 2019-03-25 ENCOUNTER — Ambulatory Visit: Payer: 59 | Attending: Internal Medicine

## 2019-03-25 DIAGNOSIS — Z23 Encounter for immunization: Secondary | ICD-10-CM | POA: Insufficient documentation

## 2019-03-25 NOTE — Progress Notes (Signed)
   Covid-19 Vaccination Clinic  Name:  Maddox Hlavaty    MRN: [id number] DOB: 12/05/1970  03/25/2019  Mr. Sheahan was observed post Covid-19 immunization for 15 minutes without incidence. He was provided with Vaccine Information Sheet and instruction to access the V-Safe system.   Mr. Hallquist was instructed to call 911 with any severe reactions post vaccine: Marland Kitchen Difficulty breathing  . Swelling of your face and throat  . A fast heartbeat  . A bad rash all over your body  . Dizziness and weakness    Immunizations Administered    Name Date Dose VIS Date Route   Pfizer COVID-19 Vaccine 03/25/2019 11:45 AM 0.3 mL 01/14/2019 Intramuscular   Manufacturer: [employer], [employer]   Lot: [id number]   NDC: [address]-6

## 2019-04-19 ENCOUNTER — Ambulatory Visit: Payer: 59 | Attending: Internal Medicine

## 2019-04-19 DIAGNOSIS — Z23 Encounter for immunization: Secondary | ICD-10-CM

## 2019-04-19 NOTE — Progress Notes (Signed)
   Covid-19 Vaccination Clinic  Name:  Robert Haney    MRN: [id number] DOB: Jan 29, 1971  04/19/2019  [name] was observed post Covid-19 immunization for 15 minutes without incident. He was provided with Vaccine Information Sheet and instruction to access the V-Safe system.   [name] was instructed to call 911 with any severe reactions post vaccine: Marland Kitchen Difficulty breathing  . Swelling of face and throat  . A fast heartbeat  . A bad rash all over body  . Dizziness and weakness   Immunizations Administered    Name Date Dose VIS Date Route   Pfizer COVID-19 Vaccine 04/19/2019 10:00 AM 0.3 mL 01/14/2019 Intramuscular   Manufacturer: [employer], [employer]   Lot: [id number]   NDC: [address]-6

## 2020-10-18 ENCOUNTER — Emergency Department (HOSPITAL_COMMUNITY): Payer: No Typology Code available for payment source

## 2020-10-18 ENCOUNTER — Emergency Department (HOSPITAL_COMMUNITY)
Admission: EM | Admit: 2020-10-18 | Discharge: 2020-10-18 | Disposition: A | Discharge status: Home / Self Care | Payer: No Typology Code available for payment source | Attending: Emergency Medicine | Admitting: Emergency Medicine

## 2020-10-18 DIAGNOSIS — I4891 Unspecified atrial fibrillation: Secondary | ICD-10-CM | POA: Diagnosis present

## 2020-10-18 DIAGNOSIS — Z79899 Other long term (current) drug therapy: Secondary | ICD-10-CM | POA: Insufficient documentation

## 2020-10-18 DIAGNOSIS — Z20822 Contact with and (suspected) exposure to covid-19: Secondary | ICD-10-CM | POA: Insufficient documentation

## 2020-10-18 DIAGNOSIS — I10 Essential (primary) hypertension: Secondary | ICD-10-CM | POA: Diagnosis not present

## 2020-10-18 DIAGNOSIS — I480 Paroxysmal atrial fibrillation: Secondary | ICD-10-CM | POA: Diagnosis not present

## 2020-10-18 LAB — CBC
HCT: 49.3 % (ref 39.0–52.0)
Hemoglobin: 15.3 g/dL (ref 13.0–17.0)
MCH: 23.9 pg — ABNORMAL LOW (ref 26.0–34.0)
MCHC: 31 g/dL (ref 30.0–36.0)
MCV: 76.9 fL — ABNORMAL LOW (ref 80.0–100.0)
Platelets: 239 10*3/uL (ref 150–400)
RBC: 6.41 MIL/uL — ABNORMAL HIGH (ref 4.22–5.81)
RDW: 17 % — ABNORMAL HIGH (ref 11.5–15.5)
WBC: 4 10*3/uL (ref 4.0–10.5)
nRBC: 0 % (ref 0.0–0.2)

## 2020-10-18 LAB — COMPREHENSIVE METABOLIC PANEL
ALT: 35 U/L (ref 0–44)
AST: 25 U/L (ref 15–41)
Albumin: 3.5 g/dL (ref 3.5–5.0)
Alkaline Phosphatase: 63 U/L (ref 38–126)
Anion gap: 9 (ref 5–15)
BUN: 17 mg/dL (ref 6–20)
CO2: 20 mmol/L — ABNORMAL LOW (ref 22–32)
Calcium: 8.3 mg/dL — ABNORMAL LOW (ref 8.9–10.3)
Chloride: 111 mmol/L (ref 98–111)
Creatinine, Ser: 1.95 mg/dL — ABNORMAL HIGH (ref 0.61–1.24)
GFR, Estimated: 41 mL/min — ABNORMAL LOW (ref >60)
Glucose, Bld: 117 mg/dL — ABNORMAL HIGH (ref 70–99)
Potassium: 3.8 mmol/L (ref 3.5–5.1)
Sodium: 140 mmol/L (ref 135–145)
Total Bilirubin: 0.5 mg/dL (ref 0.3–1.2)
Total Protein: 6.5 g/dL (ref 6.5–8.1)

## 2020-10-18 LAB — RESP PANEL BY RT-PCR (FLU A&B, COVID) ARPGX2
Influenza A by PCR: NEGATIVE
Influenza B by PCR: NEGATIVE
SARS Coronavirus 2 by RT PCR: NEGATIVE

## 2020-10-18 LAB — TROPONIN I (HIGH SENSITIVITY)
Troponin I (High Sensitivity): 10 ng/L (ref <18)
Troponin I (High Sensitivity): 10 ng/L (ref <18)

## 2020-10-18 LAB — D-DIMER, QUANTITATIVE: D-Dimer, Quant: 0.42 ug/mL-FEU (ref 0.00–0.50)

## 2020-10-18 MED ORDER — DILTIAZEM HCL ER COATED BEADS 120 MG PO CP24
120.0000 mg | ORAL_CAPSULE | Freq: Once | ORAL | Status: DC
  Filled 2020-10-18: qty 1

## 2020-10-18 NOTE — ED Triage Notes (Signed)
Pt bib [facility] from home after pt woke up from sleep with palpitations and diaphoretic. EMS found pt to be in afib with rate of 130-170. 20 mg cardizem given by ems. Rate down to 80-140. Pt denies SOB or CP. No hx of afib. Hx of htn.   BP: 160/100

## 2020-10-18 NOTE — ED Provider Notes (Signed)
Care to me. Asymptomatic and mildly bradycardic. 2nd trop negative. Will d/c with cards follow up.  Of note, he notes that he has chronic kidney disease but does not know the number.  I suspect his creatinine today is not particularly abnormal for him.  He will need to follow-up with his PCP to ensure this.   Pricilla Loveless, MD 10/18/20 [phone]

## 2020-10-18 NOTE — Discharge Instructions (Addendum)
If you develop recurrent, continued, or worsening chest pain, shortness of breath, fever, vomiting, abdominal or back pain, or any other new/concerning symptoms then return to the ER for evaluation.   Your kidney function was abnormal, it is unclear how close to her baseline this is.  Today her creatinine was 1.95.  Get this rechecked by your doctor.

## 2020-10-18 NOTE — ED Provider Notes (Signed)
[facility] EMERGENCY DEPARTMENT Provider Note   CSN: [id number] Arrival date & time: 10/18/20  8416     History Chief Complaint  Patient presents with   Atrial Fibrillation    Robert Haney is a 50 y.o. male.  [name] that yesterday at 4 PM, he was walking on the treadmill.  The treadmill recorded his heart rate as greater than 170.  He decided to take it easy the rest of the night, but he awoke with palpitations and diaphoresis.  When EMS arrived, they recorded atrial fibrillation with rates between 130 and 170.  He was given 20 mg of Cardizem.  Here in the ED, he spontaneously converted to sinus bradycardia, but he had oxygen saturations recorded in the upper 80s and was placed on 2 L of oxygen.  He currently states that he is asymptomatic.  No chest pain, diaphoresis, dyspnea.  The history is provided by the patient.  Atrial Fibrillation This is a new problem. The current episode started 1 to 2 hours ago. The problem occurs constantly. The problem has been resolved. Pertinent negatives include no chest pain, no abdominal pain, no headaches and no shortness of breath. Nothing aggravates the symptoms. Relieved by: diltiazem. The treatment provided significant relief.      Past Medical History:  Diagnosis Date   Elevated BP    elevated BP readings-2010-------- normalized when rechecked 12/01/08   Elevated LDL cholesterol level     (normal TSH 2008)   Essential hypertension 09/27/2015   Hyperlipidemia 09/27/2015   Hypertension, essential    Leukocytopenia    Mild leukocytopenia- work up including Bone Marrow bx done, normal variant- Dr. Shirline Frees, [facility] 8/11   Low testosterone    (symptomatic)--2.60 03/2008   Morbid obesity (HCC) 09/27/2015   Murmur 09/27/2015   Overweight    Palpitations 09/27/2015    Patient Active Problem List   Diagnosis Date Noted   Palpitations 09/27/2015   Essential hypertension 09/27/2015   Murmur  09/27/2015   Hyperlipidemia 09/27/2015   Morbid obesity (HCC) 09/27/2015        Family History  Problem Relation Age of Onset   Hypertension Mother    Hypertension Father    Heart attack Father    Stroke Maternal Grandmother     Social History   Tobacco Use   Smoking status: Never   Smokeless tobacco: Never  Substance Use Topics   Alcohol use: Yes    Alcohol/week: 0.0 standard drinks   Drug use: No    Home Medications Prior to Admission medications   Medication Sig Start Date End Date Taking? Authorizing Provider  acetaminophen (TYLENOL) 650 MG CR tablet Take 650 mg by mouth 2 (two) times daily as needed for pain.   Yes [provider]  amLODipine (NORVASC) 10 MG tablet Take 10 mg by mouth daily. 09/17/20  Yes [provider]  amLODipine (NORVASC) 5 MG tablet Take 1.5 tablets (7.5 mg total) by mouth daily. <PLEASE MAKE APPOINTMENT FOR REFILLS> 11/07/16  Yes Chilton Si, MD  atorvastatin (LIPITOR) 10 MG tablet Take 10 mg by mouth every evening. 09/13/20  Yes [provider]  cyclobenzaprine (FLEXERIL) 10 MG tablet Take 10 mg by mouth 3 (three) times daily as needed for muscle spasms. 08/20/15  Yes [provider]  DiphenhydrAMINE HCl (BENADRYL PO) Take 25 mg by mouth 2 (two) times daily as needed (allergies).   Yes [provider]  FARXIGA 10 MG TABS tablet Take 10 mg by mouth every  morning. 10/04/20  Yes [provider]  GINSENG PO Take 1 capsule by mouth daily.   Yes [provider]  irbesartan (AVAPRO) 150 MG tablet Take 150 mg by mouth every evening. 07/16/20  Yes [provider]  Multiple Vitamin (MULTI-VITAMIN PO) Take 1 tablet by mouth daily.   Yes [provider]  Ranitidine HCl (ZANTAC PO) Take 1 tablet by mouth daily. Zantac 150 MG Tablet 1 tablet every other day   Yes [provider]    Allergies    Patient has no known allergies.  Review of Systems   Review of Systems   Constitutional:  Positive for diaphoresis. Negative for chills and fever.  HENT:  Negative for ear pain and sore throat.   Eyes:  Negative for pain and visual disturbance.  Respiratory:  Negative for cough and shortness of breath.   Cardiovascular:  Positive for palpitations. Negative for chest pain.  Gastrointestinal:  Negative for abdominal pain and vomiting.  Genitourinary:  Negative for dysuria and hematuria.  Musculoskeletal:  Negative for arthralgias and back pain.  Skin:  Negative for color change and rash.  Neurological:  Negative for seizures, syncope and headaches.  All other systems reviewed and are negative.  Physical Exam Updated Vital Signs BP (!) 146/97   Pulse (!) 56   Temp 98.1 F (36.7 C)   Resp 20   Ht 5\' 2"  (1.575 m)   Wt 108.9 kg   SpO2 92%   BMI 43.90 kg/m   Physical Exam Vitals and nursing note reviewed.  Constitutional:      Appearance: Normal appearance.  HENT:     Head: Normocephalic and atraumatic.  Cardiovascular:     Rate and Rhythm: Regular rhythm. Bradycardia present.     Heart sounds: Normal heart sounds.  Pulmonary:     Effort: Pulmonary effort is normal.     Breath sounds: Normal breath sounds.  Abdominal:     General: There is no distension.     Tenderness: There is no abdominal tenderness. There is no guarding.  Musculoskeletal:     Cervical back: Normal range of motion.     Right lower leg: Edema present.     Left lower leg: Edema present.     Comments: 1+ edema to lower legs bilaterally  Skin:    General: Skin is warm and dry.  Neurological:     General: No focal deficit present.     Mental Status: He is alert and oriented to person, place, and time.  Psychiatric:        Mood and Affect: Mood normal.        Behavior: Behavior normal.    ED Results / Procedures / Treatments   Labs (all labs ordered are listed, but only abnormal results are displayed) Labs Reviewed  CBC - Abnormal; Notable for the following components:       Result Value   RBC 6.41 (*)    MCV 76.9 (*)    MCH 23.9 (*)    RDW 17.0 (*)    All other components within normal limits  COMPREHENSIVE METABOLIC PANEL - Abnormal; Notable for the following components:   CO2 20 (*)    Glucose, Bld 117 (*)    Creatinine, Ser 1.95 (*)    Calcium 8.3 (*)    GFR, Estimated 41 (*)    All other components within normal limits  RESP PANEL BY RT-PCR (FLU A&B, COVID) [id number]  D-DIMER, QUANTITATIVE  TROPONIN I (HIGH SENSITIVITY)  TROPONIN  I (HIGH SENSITIVITY)    EKG EKG Interpretation  Date/Time:  Thursday October 18 2020 03:06:24 EDT Ventricular Rate:  53 PR Interval:  142 QRS Duration: 91 QT Interval:  438 QTC Calculation: 412 R Axis:   -4 Text Interpretation: Sinus rhythm Minimal ST elevation, inferior leads axis normal Sinus rhythm now as compared to atrial fibrillation earlier today Confirmed by Pieter Partridge ([id number]) on 10/18/2020 3:36:21 AM  Radiology DG Chest Port 1 View  Result Date: 10/18/2020 CLINICAL DATA:  Chest pain EXAM: PORTABLE CHEST 1 VIEW COMPARISON:  None. FINDINGS: The heart size and mediastinal contours are within normal limits. Both lungs are clear. The visualized skeletal structures are unremarkable. IMPRESSION: No active disease. Electronically Signed   By: Deatra Robinson M.D.   On: 10/18/2020 03:59    Procedures Procedures   Medications Ordered in ED Medications - No data to display  ED Course  I have reviewed the triage vital signs and the nursing notes.  Pertinent labs & imaging results that were available during my care of the patient were reviewed by me and considered in my medical decision making (see chart for details).    MDM Rules/Calculators/A&P                          CHADS-VASC 1  [name] presented after a bout of atrial fibrillation.  He was treated with diltiazem, and not only did his rate respond, but he spontaneously converted to sinus rhythm.  He had a brief period of hypoxia, and for  this reason, work-up was expanded to include evaluation for possible pulmonary embolism or acute coronary syndrome.  He was able to be weaned off oxygen, and it is unclear whether or not he was truly hypoxic or if this was an erroneous pulse ox reading.  Nonetheless, work-up is reassuring so far.  1 troponin is pending.  He is a IT sales professional, and due to his relatively low risk of stroke, he will present to atrial fibrillation clinic prior to starting on anticoagulation. Final Clinical Impression(s) / ED Diagnoses Final diagnoses:  Paroxysmal atrial fibrillation [facility])    Rx / DC Orders ED Discharge Orders     None        Koleen Distance, MD 10/18/20 2319

## 2020-10-22 ENCOUNTER — Encounter (HOSPITAL_COMMUNITY): Payer: Self-pay | Admitting: Physician Assistant

## 2020-10-22 ENCOUNTER — Ambulatory Visit (HOSPITAL_COMMUNITY)
Admission: RE | Admit: 2020-10-22 | Discharge: 2020-10-22 | Disposition: A | Discharge status: Home / Self Care | Payer: 59 | Source: Ambulatory Visit | Attending: Physician Assistant | Admitting: Physician Assistant

## 2020-10-22 ENCOUNTER — Other Ambulatory Visit: Payer: Self-pay

## 2020-10-22 VITALS — BP 132/74 | HR 57 | Ht 62.0 in | Wt 237.0 lb

## 2020-10-22 DIAGNOSIS — Z7182 Exercise counseling: Secondary | ICD-10-CM | POA: Diagnosis not present

## 2020-10-22 DIAGNOSIS — I480 Paroxysmal atrial fibrillation: Secondary | ICD-10-CM | POA: Diagnosis present

## 2020-10-22 DIAGNOSIS — Z6841 Body Mass Index (BMI) 40.0 and over, adult: Secondary | ICD-10-CM | POA: Insufficient documentation

## 2020-10-22 DIAGNOSIS — Z7984 Long term (current) use of oral hypoglycemic drugs: Secondary | ICD-10-CM | POA: Insufficient documentation

## 2020-10-22 DIAGNOSIS — E785 Hyperlipidemia, unspecified: Secondary | ICD-10-CM | POA: Diagnosis not present

## 2020-10-22 DIAGNOSIS — Z8249 Family history of ischemic heart disease and other diseases of the circulatory system: Secondary | ICD-10-CM | POA: Diagnosis not present

## 2020-10-22 DIAGNOSIS — Z7901 Long term (current) use of anticoagulants: Secondary | ICD-10-CM | POA: Insufficient documentation

## 2020-10-22 DIAGNOSIS — I10 Essential (primary) hypertension: Secondary | ICD-10-CM | POA: Diagnosis not present

## 2020-10-22 DIAGNOSIS — Z79899 Other long term (current) drug therapy: Secondary | ICD-10-CM | POA: Diagnosis not present

## 2020-10-22 MED ORDER — DILTIAZEM HCL 30 MG PO TABS: ORAL_TABLET | ORAL | 1 refills | Status: DC

## 2020-10-22 NOTE — Patient Instructions (Addendum)
Cardizem 30mg -- take 1 tablet every 4 hours AS NEEDED for heart rate >100  

## 2020-10-22 NOTE — Progress Notes (Signed)
Primary Care Physician: Darrow Bussing, MD Primary Cardiologist: Dr Duke Salvia (remotely) Primary Electrophysiologist: none Referring Physician: [facility]    Robert Haney is a 50 y.o. male with a history of HLD, HTN, OSA, atrial fibrillation who presents for consultation in the [facility] Atrial Fibrillation [facility].  The patient was initially diagnosed with atrial fibrillation 10/18/20 after presenting to the ED with symptoms of palpitations and diaphoresis. He was walking on the treadmill and the treadmill recorded his heart rate as greater than 170.  He decided to take it easy the rest of the night, but he awoke with palpitations and diaphoresis.  When EMS arrived, they recorded atrial fibrillation with rates between 130 and 170. He was given diltiazem and converted to SR in the ED. Patient has a CHADS2VASC score of 1. He reports compliance with his CPAP and denies significant alcohol use. There were no specific triggers that he could identify. He has had very brief palpitations over the years but nothing sustained.  Today, he denies symptoms of chest pain, shortness of breath, orthopnea, PND, lower extremity edema, dizziness, presyncope, syncope, bleeding, or neurologic sequela. The patient is tolerating medications without difficulties and is otherwise without complaint today.    Atrial Fibrillation Risk Factors:  he does have symptoms or diagnosis of sleep apnea. he is compliant with CPAP therapy. he does not have a history of rheumatic fever. he does not have a history of alcohol use. The patient does not have a history of early familial atrial fibrillation or other arrhythmias.  he has a BMI of Body mass index is 43.35 kg/m.Marland Kitchen Filed Weights   10/22/20 0948  Weight: 107.5 kg    Family History  Problem Relation Age of Onset   Hypertension Mother    Hypertension Father    Heart attack Father    Stroke Maternal Grandmother      Atrial Fibrillation Management  history:  Previous antiarrhythmic drugs: none Previous cardioversions: none Previous ablations: none CHADS2VASC score: 1 Anticoagulation history: none   Past Medical History:  Diagnosis Date   Elevated BP    elevated BP readings-2010-------- normalized when rechecked 12/01/08   Elevated LDL cholesterol level     (normal TSH 2008)   Essential hypertension 09/27/2015   Hyperlipidemia 09/27/2015   Hypertension, essential    Leukocytopenia    Mild leukocytopenia- work up including Bone Marrow bx done, normal variant- Dr. [facility] 8/11   Low testosterone    (symptomatic)--2.60 03/2008   Morbid obesity (HCC) 09/27/2015   Murmur 09/27/2015   Overweight    Palpitations 09/27/2015   Past Surgical History:  Procedure Laterality Date   KNEE SURGERY  1994   right knee    WRIST SURGERY  1993   left wrist     Current Outpatient Medications  Medication Sig Dispense Refill   acetaminophen (TYLENOL) 650 MG CR tablet Take 650 mg by mouth 2 (two) times daily as needed for pain.     amLODipine (NORVASC) 10 MG tablet Take 10 mg by mouth daily.     atorvastatin (LIPITOR) 10 MG tablet Take 10 mg by mouth every evening.     cyclobenzaprine (FLEXERIL) 10 MG tablet Take 10 mg by mouth 3 (three) times daily as needed for muscle spasms.  0   diltiazem (CARDIZEM) 30 MG tablet Take 1 tablet every 4 hours AS NEEDED for heart rate >100 30 tablet 1   DiphenhydrAMINE HCl (BENADRYL PO) Take 25 mg by mouth 2 (two) times daily  as needed (allergies).     FARXIGA 10 MG TABS tablet Take 10 mg by mouth every morning.     GINSENG PO Take 1 capsule by mouth daily.     irbesartan (AVAPRO) 150 MG tablet Take 150 mg by mouth every evening.     Multiple Vitamin (MULTI-VITAMIN PO) Take 1 tablet by mouth daily.     Ranitidine HCl (ZANTAC PO) Take 1 tablet by mouth daily. Zantac 150 MG Tablet 1 tablet every other day     No current facility-administered medications for this encounter.    No Known  Allergies  Social History   Socioeconomic History   Marital status: Married    Spouse name: Not on file   Number of children: 4   Years of education: Not on file   Highest education level: Not on file  Occupational History   Occupation: FIREFIGHTER  Tobacco Use   Smoking status: Never   Smokeless tobacco: Never  Substance and Sexual Activity   Alcohol use: Yes    Alcohol/week: 1.0 standard drink    Types: 1 Standard drinks or equivalent per week   Drug use: No   Sexual activity: Not on file  Other Topics Concern   Not on file  Social History Narrative   Not on file   Social Determinants of Health   Financial Resource Strain: Not on file  Food Insecurity: Not on file  Transportation Needs: Not on file  Physical Activity: Not on file  Stress: Not on file  Social Connections: Not on file  Intimate Partner Violence: Not on file     ROS- All systems are reviewed and negative except as per the HPI above.  Physical Exam: Vitals:   10/22/20 0948  BP: 132/74  Pulse: (!) 57  Weight: 107.5 kg  Height: 5\' 2"  (1.575 m)    GEN- The patient is a well appearing obese male, alert and oriented x 3 today.   Head- normocephalic, atraumatic Eyes-  Sclera clear, conjunctiva pink Ears- hearing intact Oropharynx- clear Neck- supple  Lungs- Clear to ausculation bilaterally, normal work of breathing Heart- Regular rate and rhythm, no murmurs, rubs or gallops  GI- soft, NT, ND, + BS Extremities- no clubbing, cyanosis, or edema MS- no significant deformity or atrophy Skin- no rash or lesion Psych- euthymic mood, full affect Neuro- strength and sensation are intact  Wt Readings from Last 3 Encounters:  10/22/20 107.5 kg  10/18/20 108.9 kg  11/14/15 106.7 kg    EKG today demonstrates  SB Vent. rate 57 BPM PR interval 146 ms QRS duration 86 ms QT/QTcB 424/412 ms  Echo 10/09/15 demonstrated  - Left ventricle: The cavity size was normal. Wall thickness was    increased in  a pattern of mild LVH. Systolic function was normal.    The estimated ejection fraction was in the range of 60% to 65%.    Wall motion was normal; there were no regional wall motion    abnormalities. Left ventricular diastolic function parameters    were normal.   Epic records are reviewed at length today  CHA2DS2-VASc Score = 1  The patient's score is based upon: CHF History: 0 HTN History: 1 Diabetes History: 0 Stroke History: 0 Vascular Disease History: 0 Age Score: 0 Gender Score: 0      ASSESSMENT AND PLAN: 1. Paroxysmal Atrial Fibrillation (ICD10:  I48.0) The patient's CHA2DS2-VASc score is 1, indicating a 0.6% annual risk of stroke.   General education about afib provided and questions  answered. We also discussed his stroke risk and the risks and benefits of anticoagulation. No anticoagulation indicated at this time with low CV score. Will start PRN diltiazem 30 mg q 4 hours for heart racing. Check echocardiogram.  2. Obesity Body mass index is 43.35 kg/m. Lifestyle modification was discussed at length including regular exercise and weight reduction.  3. Obstructive sleep apnea The importance of adequate treatment of sleep apnea was discussed today in order to improve our ability to maintain sinus rhythm long term. Patient reports compliance with CPAP therapy.  4. HTN Stable, no changes today.   Follow up in the AF clinic 4-6 weeks.   Jorja Loa PA-C Afib Clinic [facility] [address] [place], [place] [address] [phone] 10/22/2020 10:17 AM

## 2020-10-24 ENCOUNTER — Encounter (HOSPITAL_COMMUNITY): Payer: Self-pay | Admitting: *Deleted

## 2020-11-21 ENCOUNTER — Ambulatory Visit (HOSPITAL_COMMUNITY)
Admission: RE | Admit: 2020-11-21 | Discharge: 2020-11-21 | Disposition: A | Discharge status: Home / Self Care | Payer: 59 | Source: Ambulatory Visit | Attending: Physician Assistant | Admitting: Physician Assistant

## 2020-11-21 ENCOUNTER — Other Ambulatory Visit: Payer: Self-pay

## 2020-11-21 DIAGNOSIS — I10 Essential (primary) hypertension: Secondary | ICD-10-CM | POA: Insufficient documentation

## 2020-11-21 DIAGNOSIS — E785 Hyperlipidemia, unspecified: Secondary | ICD-10-CM | POA: Insufficient documentation

## 2020-11-21 DIAGNOSIS — I480 Paroxysmal atrial fibrillation: Secondary | ICD-10-CM | POA: Insufficient documentation

## 2020-11-21 LAB — ECHOCARDIOGRAM COMPLETE
Area-P 1/2: 3.63 cm2
S' Lateral: 3.1 cm

## 2020-11-23 ENCOUNTER — Ambulatory Visit (HOSPITAL_COMMUNITY)
Admission: RE | Admit: 2020-11-23 | Discharge: 2020-11-23 | Disposition: A | Discharge status: Home / Self Care | Payer: 59 | Source: Ambulatory Visit | Attending: Physician Assistant | Admitting: Physician Assistant

## 2020-11-23 ENCOUNTER — Encounter (HOSPITAL_COMMUNITY): Payer: Self-pay | Admitting: Physician Assistant

## 2020-11-23 ENCOUNTER — Other Ambulatory Visit: Payer: Self-pay

## 2020-11-23 VITALS — BP 144/100 | HR 59 | Ht 62.0 in | Wt 245.2 lb

## 2020-11-23 DIAGNOSIS — I480 Paroxysmal atrial fibrillation: Secondary | ICD-10-CM | POA: Insufficient documentation

## 2020-11-23 DIAGNOSIS — Z7984 Long term (current) use of oral hypoglycemic drugs: Secondary | ICD-10-CM | POA: Insufficient documentation

## 2020-11-23 DIAGNOSIS — Z79899 Other long term (current) drug therapy: Secondary | ICD-10-CM | POA: Insufficient documentation

## 2020-11-23 DIAGNOSIS — E785 Hyperlipidemia, unspecified: Secondary | ICD-10-CM | POA: Diagnosis not present

## 2020-11-23 DIAGNOSIS — I10 Essential (primary) hypertension: Secondary | ICD-10-CM | POA: Diagnosis not present

## 2020-11-23 DIAGNOSIS — Z6841 Body Mass Index (BMI) 40.0 and over, adult: Secondary | ICD-10-CM | POA: Diagnosis not present

## 2020-11-23 DIAGNOSIS — G4733 Obstructive sleep apnea (adult) (pediatric): Secondary | ICD-10-CM | POA: Diagnosis not present

## 2020-11-23 NOTE — Progress Notes (Signed)
Primary Care Physician: Darrow Bussing, MD Primary Cardiologist: Dr Duke Salvia (remotely) Primary Electrophysiologist: none Referring Physician: [facility]    Robert Haney is a 50 y.o. male with a history of HLD, HTN, OSA, atrial fibrillation who presents for follow up in the [facility].  The patient was initially diagnosed with atrial fibrillation 10/18/20 after presenting to the ED with symptoms of palpitations and diaphoresis. He was walking on the treadmill and the treadmill recorded his heart rate as greater than 170.  He decided to take it easy the rest of the night, but he awoke with palpitations and diaphoresis.  When EMS arrived, they recorded atrial fibrillation with rates between 130 and 170. He was given diltiazem and converted to SR in the ED. Patient has a CHADS2VASC score of 1. He reports compliance with his CPAP and denies significant alcohol use. He has had very brief palpitations over the years but nothing sustained.  On follow up today, patient reports that he has done well since his last visit. He denies any heart racing or palpitations. He had an echo which showed EF 55-60%, no valve issues.   Today, he denies symptoms of palpitations, chest pain, shortness of breath, orthopnea, PND, lower extremity edema, dizziness, presyncope, syncope, bleeding, or neurologic sequela. The patient is tolerating medications without difficulties and is otherwise without complaint today.    Atrial Fibrillation Risk Factors:  he does have symptoms or diagnosis of sleep apnea. he is compliant with CPAP therapy. he does not have a history of rheumatic fever. he does not have a history of alcohol use. The patient does not have a history of early familial atrial fibrillation or other arrhythmias.  he has a BMI of Body mass index is 44.85 kg/m.Marland Kitchen Filed Weights   11/23/20 0958  Weight: 111.2 kg    Family History  Problem Relation Age of Onset    Hypertension Mother    Hypertension Father    Heart attack Father    Stroke Maternal Grandmother      Atrial Fibrillation Management history:  Previous antiarrhythmic drugs: none Previous cardioversions: none Previous ablations: none CHADS2VASC score: 1 Anticoagulation history: none   Past Medical History:  Diagnosis Date   Elevated BP    elevated BP readings-2010-------- normalized when rechecked 12/01/08   Elevated LDL cholesterol level     (normal TSH 2008)   Essential hypertension 09/27/2015   Hyperlipidemia 09/27/2015   Hypertension, essential    Leukocytopenia    Mild leukocytopenia- work up including Bone Marrow bx done, normal variant- Dr. Penelope Galas [facility] 8/11   Low testosterone    (symptomatic)--2.60 03/2008   Morbid obesity (HCC) 09/27/2015   Murmur 09/27/2015   Overweight    Palpitations 09/27/2015   Past Surgical History:  Procedure Laterality Date   KNEE SURGERY  1994   right knee    WRIST SURGERY  1993   left wrist     Current Outpatient Medications  Medication Sig Dispense Refill   acetaminophen (TYLENOL) 650 MG CR tablet Take 650 mg by mouth 2 (two) times daily as needed for pain.     amLODipine (NORVASC) 10 MG tablet Take 10 mg by mouth daily.     atorvastatin (LIPITOR) 10 MG tablet Take 10 mg by mouth every evening.     cyclobenzaprine (FLEXERIL) 10 MG tablet Take 10 mg by mouth 3 (three) times daily as needed for muscle spasms.  0   diltiazem (CARDIZEM) 30 MG tablet Take 1  tablet every 4 hours AS NEEDED for heart rate >100 30 tablet 1   DiphenhydrAMINE HCl (BENADRYL PO) Take 25 mg by mouth 2 (two) times daily as needed (allergies).     FARXIGA 10 MG TABS tablet Take 10 mg by mouth every morning.     GINSENG PO Take 1 capsule by mouth daily.     irbesartan (AVAPRO) 300 MG tablet Take 300 mg by mouth at bedtime.     Multiple Vitamin (MULTI-VITAMIN PO) Take 1 tablet by mouth daily.     Ranitidine HCl (ZANTAC PO) Take 1 tablet by mouth  daily. Zantac 150 MG Tablet 1 tablet every other day     No current facility-administered medications for this encounter.    No Known Allergies  Social History   Socioeconomic History   Marital status: Media planner    Spouse name: Not on file   Number of children: 4   Years of education: Not on file   Highest education level: Not on file  Occupational History   Occupation: FIREFIGHTER  Tobacco Use   Smoking status: Never   Smokeless tobacco: Never  Substance and Sexual Activity   Alcohol use: Yes    Alcohol/week: 2.0 standard drinks    Types: 1 Cans of beer, 1 Standard drinks or equivalent per week   Drug use: No   Sexual activity: Not on file  Other Topics Concern   Not on file  Social History Narrative   Not on file   Social Determinants of Health   Financial Resource Strain: Not on file  Food Insecurity: Not on file  Transportation Needs: Not on file  Physical Activity: Not on file  Stress: Not on file  Social Connections: Not on file  Intimate Partner Violence: Not on file     ROS- All systems are reviewed and negative except as per the HPI above.  Physical Exam: Vitals:   11/23/20 0958  BP: (!) 144/100  Pulse: (!) 59  Weight: 111.2 kg  Height: 5\' 2"  (1.575 m)   GEN- The patient is a well appearing obese male, alert and oriented x 3 today.   HEENT-head normocephalic, atraumatic, sclera clear, conjunctiva pink, hearing intact, trachea midline. Lungs- Clear to ausculation bilaterally, normal work of breathing Heart- Regular rate and rhythm, no murmurs, rubs or gallops  GI- soft, NT, ND, + BS Extremities- no clubbing, cyanosis, or edema MS- no significant deformity or atrophy Skin- no rash or lesion Psych- euthymic mood, full affect Neuro- strength and sensation are intact   Wt Readings from Last 3 Encounters:  11/23/20 111.2 kg  10/22/20 107.5 kg  10/18/20 108.9 kg    EKG today demonstrates  SB Vent. rate 59 BPM PR interval 156 ms QRS  duration 82 ms QT/QTcB 402/397 ms  Echo 11/21/20 demonstrated   1. Left ventricular ejection fraction, by estimation, is 55 to 60%. Left  ventricular ejection fraction by 3D volume is 57 %. The left ventricle has normal function. The left ventricle has no regional wall motion  abnormalities. Left ventricular diastolic  parameters were normal. The average left ventricular global longitudinal  strain is -21.8 %. The global longitudinal strain is normal.   2. Right ventricular systolic function is normal. The right ventricular  size is normal. Tricuspid regurgitation signal is inadequate for assessing PA pressure.   3. The mitral valve is normal in structure. Trivial mitral valve  regurgitation. No evidence of mitral stenosis.   4. The aortic valve is normal in structure. Aortic  valve regurgitation is  not visualized. No aortic stenosis is present.   5. The inferior vena cava is normal in size with greater than 50%  respiratory variability, suggesting right atrial pressure of 3 mmHg.   Epic records are reviewed at length today  CHA2DS2-VASc Score = 1  The patient's score is based upon: CHF History: 0 HTN History: 1 Diabetes History: 0 Stroke History: 0 Vascular Disease History: 0 Age Score: 0 Gender Score: 0      ASSESSMENT AND PLAN: 1. Paroxysmal Atrial Fibrillation (ICD10:  I48.0) The patient's CHA2DS2-VASc score is 1, indicating a 0.6% annual risk of stroke.   Patient appears to be maintaining SR. No anticoagulation indicated at this time with low CV score. Continue PRN diltiazem 30 mg q 4 hours for heart racing.  2. Obesity Body mass index is 44.85 kg/m. Lifestyle modification was discussed and encouraged including regular physical activity and weight reduction.  3. Obstructive sleep apnea Patient reports compliance with CPAP therapy.  4. HTN Elevated today, was controlled at last visit. He reports that it is better controlled when he checks at home. No change  today.   Will have him follow up with Dr Duke Salvia to reestablish care. AF clinic as needed.    Jorja Loa PA-C Afib Clinic [facility] [address] [place], [place] [address] [phone] 11/23/2020 10:04 AM

## 2021-01-08 NOTE — Progress Notes (Signed)
Cardiology Office Note:    Date:  01/09/2021   ID:  Robert Haney, DOB [birth year], MRN [id number]  PCP:  Robert Amel, MD  Cardiologist:  None    Referring MD: Robert Amel, MD   No chief complaint on file.   History of Present Illness:    Robert Haney is a 50 y.o. male with a hx of Afib, hypertension, hyperlipidemia, obesity, and OSA with CPAP who presents today for evaluation of Afib. He was initially diagnosed with Afib 10/18/20 after presenting to the ED with palpitations and diaphoreses. Diltiazem was started and he was converted to sinus rhythm in the ED. Robert Haney saw Dr. Lujean Haney on 08/20/15.  At that appointment he noted palpitations.  He was referred to cardiology for evaluation.  For over a year he had noted episodes of irregular heart rates.  He felt as though his heart was beating irregularly fourth 2-5 minutes. It also made him feel like he needed to cough. He noticed it more frequently when driving, laying down or in stressful situations.  He did not notice it with exertion.  These episodes occurred at least once per month. He was not started on anticoagulation due CH2DS2-VASC score of 1. He was given diltiazem to take as needed. He visited the Afib clinic 11/23/20 and did not have recurrent Afib. He was recommended to reestablish care with cardiology. Echo from 11/2020 revealed LVEF 55-60%.   Today, he is still experiencing palpitations. Occasionally, he notices swelling in his bilateral LE. He reports needing to use the bathroom twice nightly. He occasionally records his blood pressure at home and reports measurements of 144/80 on average. He exercises 2 to 3 times every week while he is at work. However, he does not enjoy exercising. He is improving his diet and has lost some weight. He avoids adding salt and opts to cook food at home or work. He takes ginseng for overall health but only when he remembers. He denies any shortness of breath, lightheadedness,  headaches, syncope, orthopnea, PND, or exertional symptoms.  Past Medical History:  Diagnosis Date   Elevated BP    elevated BP readings-2010-------- normalized when rechecked 12/01/08   Elevated LDL cholesterol level     (normal TSH 2008)   Essential hypertension 09/27/2015   Hyperlipidemia 09/27/2015   Hypertension, essential    Leukocytopenia    Mild leukocytopenia- work up including Bone Marrow bx done, normal variant- Dr. Janie Morning [facility] 8/11   Low testosterone    (symptomatic)--2.60 03/2008   Morbid obesity ([place]) 09/27/2015   Murmur 09/27/2015   Overweight    Palpitations 09/27/2015    Past Surgical History:  Procedure Laterality Date   KNEE SURGERY  1994   right knee    WRIST SURGERY  1993   left wrist     Current Medications: Current Meds  Medication Sig   acetaminophen (TYLENOL) 650 MG CR tablet Take 650 mg by mouth 2 (two) times daily as needed for pain.   amLODipine (NORVASC) 10 MG tablet Take 10 mg by mouth daily.   atorvastatin (LIPITOR) 10 MG tablet Take 10 mg by mouth every evening.   cyclobenzaprine (FLEXERIL) 10 MG tablet Take 10 mg by mouth 3 (three) times daily as needed for muscle spasms.   diltiazem (CARDIZEM) 30 MG tablet Take 1 tablet every 4 hours AS NEEDED for heart rate >100   DiphenhydrAMINE HCl (BENADRYL PO) Take 25 mg by mouth 2 (two) times daily as needed (allergies).   doxazosin (CARDURA)  2 MG tablet Take 1 tablet (2 mg total) by mouth at bedtime.   FARXIGA 10 MG TABS tablet Take 10 mg by mouth every morning.   GINSENG PO Take 1 capsule by mouth daily.   irbesartan (AVAPRO) 300 MG tablet Take 300 mg by mouth at bedtime.   Multiple Vitamin (MULTI-VITAMIN PO) Take 1 tablet by mouth daily.   Ranitidine HCl (ZANTAC PO) Take 1 tablet by mouth daily. Zantac 150 MG Tablet 1 tablet every other day     Allergies:   Patient has no known allergies.   Social History   Socioeconomic History   Marital status: Soil scientist    Spouse  name: Not on file   Number of children: 4   Years of education: Not on file   Highest education level: Not on file  Occupational History   Occupation: FIREFIGHTER  Tobacco Use   Smoking status: Never   Smokeless tobacco: Never  Substance and Sexual Activity   Alcohol use: Yes    Alcohol/week: 2.0 standard drinks    Types: 1 Cans of beer, 1 Standard drinks or equivalent per week   Drug use: No   Sexual activity: Not on file  Other Topics Concern   Not on file  Social History Narrative   Not on file   Social Determinants of Health   Financial Resource Strain: Low Risk    Difficulty of Paying Living Expenses: Not hard at all  Food Insecurity: No Food Insecurity   Worried About Charity fundraiser in the Last Year: Never true   Ran Out of Food in the Last Year: Never true  Transportation Needs: No Transportation Needs   Lack of Transportation (Medical): No   Lack of Transportation (Non-Medical): No  Physical Activity: Insufficiently Active   Days of Exercise per Week: 3 days   Minutes of Exercise per Session: 30 min  Stress: Not on file  Social Connections: Not on file     Family History: The patient's family history includes Heart attack in his father; Hypertension in his father and mother; Stroke in his maternal grandmother.  ROS:   Please see the history of present illness.    (+) Palpitations (+) Urinary frequency (+) Bilateral LE swelling All other systems reviewed and negative.   EKGs/Labs/Other Studies Reviewed:    The following studies were reviewed today: Echo 11/21/20   1. Left ventricular ejection fraction, by estimation, is 55 to 60%. Left  ventricular ejection fraction by 3D volume is 57 %. The left ventricle has normal function. The left ventricle has no regional wall motion  abnormalities. Left ventricular diastolic  parameters were normal. The average left ventricular global longitudinal strain is -21.8 %. The global longitudinal strain is normal.    2. Right ventricular systolic function is normal. The right ventricular  size is normal. Tricuspid regurgitation signal is inadequate for assessing PA pressure.   3. The mitral valve is normal in structure. Trivial mitral valve  regurgitation. No evidence of mitral stenosis.   4. The aortic valve is normal in structure. Aortic valve regurgitation is  not visualized. No aortic stenosis is present.   5. The inferior vena cava is normal in size with greater than 50%  respiratory variability, suggesting right atrial pressure of 3 mmHg.   Echo 10/09/15 - Left ventricle: The cavity size was normal. Wall thickness was    increased in a pattern of mild LVH. Systolic function was normal.    The estimated ejection fraction was in  the range of 60% to 65%.    Wall motion was normal; there were no regional wall motion    abnormalities. Left ventricular diastolic function parameters    were normal.   Monitor 10/03/15 30 Day Event Monitor Quality: Fair.  Baseline artifact. Predominant rhythm: sinus rhythm, sinus bradycardia.  Max heart rate: 110 bpm Min heart rate:40 bpm Occasional PVCs  EKG:   01/09/21: Sinus rhythm, rate 61 bpm  Recent Labs: 10/18/2020: ALT 35; BUN 17; Creatinine, Ser 1.95; Hemoglobin 15.3; Platelets 239; Potassium 3.8; Sodium 140   Recent Lipid Panel No results found for: CHOL, TRIG, HDL, CHOLHDL, VLDL, LDLCALC, LDLDIRECT  CHA2DS2-VASc Score = 1 [CHF History: 0, HTN History: 1, Diabetes History: 0, Stroke History: 0, Vascular Disease History: 0, Age Score: 0, Gender Score: 0].  Therefore, the patient's annual risk of stroke is 0.6 %.        Physical Exam:    VS:  BP 140/86 (BP Location: Right Arm, Patient Position: Sitting, Cuff Size: Large)   Pulse 61   Ht 5\' 2"  (1.575 m)   Wt 238 lb 6.4 oz (108.1 kg)   BMI 43.60 kg/m  , BMI Body mass index is 43.6 kg/m. GENERAL:  Well appearing HEENT: Pupils equal round and reactive, fundi not visualized, oral mucosa  unremarkable NECK:  No jugular venous distention, waveform within normal limits, carotid upstroke brisk and symmetric, no bruits, no thyromegaly LUNGS:  Clear to auscultation bilaterally HEART:  RRR.  PMI not displaced or sustained,S1 and S2 within normal limits, no S3, no S4, no clicks, no rubs, no murmurs ABD:  Flat, positive bowel sounds normal in frequency in pitch, no bruits, no rebound, no guarding, no midline pulsatile mass, no hepatomegaly, no splenomegaly EXT:  2 plus pulses throughout, no edema, no cyanosis no clubbing SKIN:  No rashes no nodules NEURO:  Cranial nerves II through XII grossly intact, motor grossly intact throughout PSYCH:  Cognitively intact, oriented to person place and time  ASSESSMENT:    1. Hyperlipidemia, unspecified hyperlipidemia type   2. Essential hypertension   3. Paroxysmal atrial fibrillation (HCC)    PLAN:    Essential hypertension BP poorly controlled both here and at home.  He will continue irbesartan and amlodipine.  He has been taking ginseng randomly.  We did discuss the fact that this can raise blood pressure and he will stop taking it.  We will add doxazosin 2 mg nightly.  We did discuss adding a diuretic but he already has to go to the bathroom frequently so we will try to avoid this.  He will check his blood pressures and bring to follow-up.  Paroxysmal atrial fibrillation (HCC) He has had short episodes of palpitations but nothing sustained.  He has not needed to take diltiazem.  He is not on anticoagulation due to CHA2DS2-VASc score of 1.  Continue to monitor.  Continue CPAP and keep working on weight loss.  Hyperlipidemia Lipids are well have been controlled on atorvastatin.  In order of problems listed above:     Medication Adjustments/Labs and Tests Ordered: Current medicines are reviewed at length with the patient today.  Concerns regarding medicines are outlined above.  Orders Placed This Encounter  Procedures   AMB Referral to  Heartcare Pharm-D   EKG 12-Lead    Meds ordered this encounter  Medications   doxazosin (CARDURA) 2 MG tablet    Sig: Take 1 tablet (2 mg total) by mouth at bedtime.    Dispense:  90 tablet  Refill:  1    Disposition: FU with APP/PharmD in 1-2 months FU with Robert Lippman C. Duke Salvia, MD, [facility] in 6 months  I,Mykaella Javier,acting as a scribe for Chilton Si, MD.,have documented all relevant documentation on the behalf of Chilton Si, MD,as directed by  Chilton Si, MD while in the presence of Chilton Si, MD.  I, Achille Xiang C. Duke Salvia, MD have reviewed all documentation for this visit.  The documentation of the exam, diagnosis, procedures, and orders on 01/09/2021 are all accurate and complete.   Signed, Chilton Si, MD  01/09/2021 1:39 PM    [facility]

## 2021-01-09 ENCOUNTER — Other Ambulatory Visit: Payer: Self-pay

## 2021-01-09 ENCOUNTER — Ambulatory Visit (HOSPITAL_BASED_OUTPATIENT_CLINIC_OR_DEPARTMENT_OTHER): Payer: 59 | Admitting: Cardiovascular Disease

## 2021-01-09 ENCOUNTER — Encounter (HOSPITAL_BASED_OUTPATIENT_CLINIC_OR_DEPARTMENT_OTHER): Payer: Self-pay | Admitting: Cardiovascular Disease

## 2021-01-09 VITALS — BP 140/86 | HR 61 | Ht 62.0 in | Wt 238.4 lb

## 2021-01-09 DIAGNOSIS — E785 Hyperlipidemia, unspecified: Secondary | ICD-10-CM | POA: Diagnosis not present

## 2021-01-09 DIAGNOSIS — I480 Paroxysmal atrial fibrillation: Secondary | ICD-10-CM

## 2021-01-09 DIAGNOSIS — I10 Essential (primary) hypertension: Secondary | ICD-10-CM

## 2021-01-09 MED ORDER — DOXAZOSIN MESYLATE 2 MG PO TABS: 2.0000 mg | ORAL_TABLET | Freq: Every day | ORAL | 1 refills | Status: DC

## 2021-01-09 NOTE — Patient Instructions (Addendum)
Medication Instructions:  START DOXAZOSIN 2 MG AT BEDTIME   *If you need a refill on your cardiac medications before your next appointment, please call your pharmacy*  Lab Work: NONE  Testing/Procedures: NONE  Follow-Up: At [facility], you and your health needs are our priority.  As part of our continuing mission to provide you with exceptional heart care, we have created designated Provider Care Teams.  These Care Teams include your primary Cardiologist (physician) and Advanced Practice Providers (APPs -  Physician Assistants and Nurse Practitioners) who all work together to provide you with the care you need, when you need it.  We recommend signing up for the patient portal called "MyChart".  Sign up information is provided on this After Visit Summary.  MyChart is used to connect with patients for Virtual Visits (Telemedicine).  Patients are able to view lab/test results, encounter notes, upcoming appointments, etc.  Non-urgent messages can be sent to your provider as well.   To learn more about what you can do with MyChart, go to [email or web address].    Your next appointment:   6 month(s)  The format for your next appointment:   In Person  Provider:   Chilton Si, MD   Your physician recommends that you schedule a follow-up appointment in: 1-2 MONTHS WITH PHARM D

## 2021-01-09 NOTE — Assessment & Plan Note (Signed)
BP poorly controlled both here and at home.  He will continue irbesartan and amlodipine.  He has been taking ginseng randomly.  We did discuss the fact that this can raise blood pressure and he will stop taking it.  We will add doxazosin 2 mg nightly.  We did discuss adding a diuretic but he already has to go to the bathroom frequently so we will try to avoid this.  He will check his blood pressures and bring to follow-up.

## 2021-01-09 NOTE — Assessment & Plan Note (Signed)
Lipids are well have been controlled on atorvastatin. 

## 2021-01-09 NOTE — Assessment & Plan Note (Signed)
He has had short episodes of palpitations but nothing sustained.  He has not needed to take diltiazem.  He is not on anticoagulation due to CHA2DS2-VASc score of 1.  Continue to monitor.  Continue CPAP and keep working on weight loss.

## 2021-03-12 ENCOUNTER — Other Ambulatory Visit: Payer: Self-pay

## 2021-03-12 ENCOUNTER — Ambulatory Visit: Payer: 59 | Admitting: Pharmacist Clinician (PhC)/ Clinical Pharmacy Specialist

## 2021-03-12 DIAGNOSIS — I10 Essential (primary) hypertension: Secondary | ICD-10-CM

## 2021-03-12 MED ORDER — DOXAZOSIN MESYLATE 4 MG PO TABS: 4.0000 mg | ORAL_TABLET | Freq: Every day | ORAL | 3 refills | Status: DC

## 2021-03-12 NOTE — Progress Notes (Signed)
03/15/2021 Robert Haney [birth year] [id number]   HPI:  Robert Haney is a 51 y.o. male patient of Dr Oval Linsey, with a PMH below who presents today for hypertension clinic evaluation.  He was seen by Dr. Oval Linsey in December after being referred to cardiology for paroxysmal AF.  His pressure in the office that day was 140/86, and he noted tha home readings were similar to that.  Dr. Oval Linsey asked that he discontinue using ginseng and added doxazosin 2 mg nightly to his irbesartan and amlodipine.  He was asked to monitor home readings and follow up after 2 months.     Past Medical History: hyperlipidemia 8/22 LDL 86 on atorvastatin 10  AF Paroxysmal, has diltiazem for prn use, CHADS2-VASc score of 1, not currently on anticoagulation  OSA      Blood Pressure Goal:  130/80  Current Medications: irbesartan 300 mg qhs, amlodipine 10 mg qam, doxazosin 2 mg qhs  Family Hx: both parents with hypertension, mgm also; mother now 9 with BP controlled; father died at 35 kidney disease; 2 sisters with hypertension; daughter 26 with thyroid cancer  Social Hx: no tobacco, occasional alcohol, coffee daily  Diet: mix of home and eating out, out is mix of fast food, sit down; no added salt; some vegetables, mix of meats; some snacking  Exercise: mix of cardio and weights, 20-30 min of each per workout - 2-3 times each week   Home BP readings:  no readings with him today, by recall - 140's over high 90's; some [id number], lowest diastolic 76  Intolerances: hctz - headaches  Labs: 9/22 - Na 140, K  3.8, Glu 117, BUN 17, SCr 1.95, GFR 41   Wt Readings from Last 3 Encounters:  03/12/21 243 lb (110.2 kg)  01/09/21 238 lb 6.4 oz (108.1 kg)  11/23/20 245 lb 3.2 oz (111.2 kg)   BP Readings from Last 3 Encounters:  03/12/21 (!) 146/84  01/09/21 140/86  11/23/20 (!) 144/100   Pulse Readings from Last 3 Encounters:  03/12/21 62  01/09/21 61  11/23/20 (!) 59    Current Outpatient  Medications  Medication Sig Dispense Refill   acetaminophen (TYLENOL) 650 MG CR tablet Take 650 mg by mouth 2 (two) times daily as needed for pain.     amLODipine (NORVASC) 10 MG tablet Take 10 mg by mouth daily.     atorvastatin (LIPITOR) 10 MG tablet Take 10 mg by mouth every evening.     cyclobenzaprine (FLEXERIL) 10 MG tablet Take 10 mg by mouth 3 (three) times daily as needed for muscle spasms.  0   diltiazem (CARDIZEM) 30 MG tablet Take 1 tablet every 4 hours AS NEEDED for heart rate >100 30 tablet 1   DiphenhydrAMINE HCl (BENADRYL PO) Take 25 mg by mouth 2 (two) times daily as needed (allergies).     doxazosin (CARDURA) 4 MG tablet Take 1 tablet (4 mg total) by mouth daily. 90 tablet 3   FARXIGA 10 MG TABS tablet Take 10 mg by mouth every morning.     irbesartan (AVAPRO) 300 MG tablet Take 300 mg by mouth at bedtime.     Multiple Vitamin (MULTI-VITAMIN PO) Take 1 tablet by mouth daily.     Ranitidine HCl (ZANTAC PO) Take 1 tablet by mouth daily. Zantac 150 MG Tablet 1 tablet every other day     No current facility-administered medications for this visit.    Allergies  Allergen Reactions   Hydrochlorothiazide     Other  reaction(s): headaches    Past Medical History:  Diagnosis Date   Elevated BP    elevated BP readings-2010-------- normalized when rechecked 12/01/08   Elevated LDL cholesterol level     (normal TSH 2008)   Essential hypertension 09/27/2015   Hyperlipidemia 09/27/2015   Hypertension, essential    Leukocytopenia    Mild leukocytopenia- work up including Bone Marrow bx done, normal variant- Dr. Janie Morning [facility] 8/11   Low testosterone    (symptomatic)--2.60 03/2008   Morbid obesity ([place]) 09/27/2015   Murmur 09/27/2015   Overweight    Palpitations 09/27/2015    Blood pressure (!) 146/84, pulse 62, resp. rate 15, height 5' 2.5" (1.588 m), weight 243 lb (110.2 kg), SpO2 100 %.  Essential hypertension Patient with essential hypertension, not yet to  goal on current therapy.  Will increase doxazosin to 4 mg each night and continue with other medications.  He was asked to continue with regular home BP monitoring, 3-4 times each week, and return in 2 months for follow up.  Would consider getting metabolic panel and adding small amount of chlorthalidone or spironolactone if his pressure is unchanged and renal function stable.     Tommy Medal PharmD CPP [facility] [address] [place], [place] [address] [phone]

## 2021-03-12 NOTE — Patient Instructions (Signed)
Return for a a follow up appointment April 11 at 10 am  Check your blood pressure at home 3-4 times each week and keep record of the readings.  Take your BP meds as follows:  Increase doxazosin to 4 mg each night  Continue with all other medications  Bring all of your meds, your BP cuff and your record of home blood pressures to your next appointment.  Exercise as youre able, try to walk approximately 30 minutes per day.  Keep salt intake to a minimum, especially watch canned and prepared boxed foods.  Eat more fresh fruits and vegetables and fewer canned items.  Avoid eating in fast food restaurants.    HOW TO TAKE YOUR BLOOD PRESSURE: Rest 5 minutes before taking your blood pressure.  Dont smoke or drink caffeinated beverages for at least 30 minutes before. Take your blood pressure before (not after) you eat. Sit comfortably with your back supported and both feet on the floor (dont cross your legs). Elevate your arm to heart level on a table or a desk. Use the proper sized cuff. It should fit smoothly and snugly around your bare upper arm. There should be enough room to slip a fingertip under the cuff. The bottom edge of the cuff should be 1 inch above the crease of the elbow. Ideally, take 3 measurements at one sitting and record the average.

## 2021-03-15 ENCOUNTER — Encounter: Payer: Self-pay | Admitting: Pharmacist Clinician (PhC)/ Clinical Pharmacy Specialist

## 2021-03-15 NOTE — Assessment & Plan Note (Signed)
Patient with essential hypertension, not yet to goal on current therapy.  Will increase doxazosin to 4 mg each night and continue with other medications.  He was asked to continue with regular home BP monitoring, 3-4 times each week, and return in 2 months for follow up.  Would consider getting metabolic panel and adding small amount of chlorthalidone or spironolactone if his pressure is unchanged and renal function stable.

## 2021-05-14 ENCOUNTER — Encounter: Payer: Self-pay | Admitting: Pharmacist Clinician (PhC)/ Clinical Pharmacy Specialist

## 2021-05-14 ENCOUNTER — Ambulatory Visit: Payer: 59 | Admitting: Pharmacist Clinician (PhC)/ Clinical Pharmacy Specialist

## 2021-05-14 DIAGNOSIS — I10 Essential (primary) hypertension: Secondary | ICD-10-CM | POA: Diagnosis not present

## 2021-05-14 NOTE — Patient Instructions (Signed)
Return for a a follow up appointment with Dr. Oval Linsey in June ? ?Check your blood pressure at home 2-3 times each week and keep record of the readings. ? ?Take your BP meds as follows: ? Continue with your current medications ? ?Bring all of your meds, your BP cuff and your record of home blood pressures to your next appointment.  Exercise as you?re able, try to walk approximately 30 minutes per day.  Keep salt intake to a minimum, especially watch canned and prepared boxed foods.  Eat more fresh fruits and vegetables and fewer canned items.  Avoid eating in fast food restaurants.  ? ? HOW TO TAKE YOUR BLOOD PRESSURE: ?Rest 5 minutes before taking your blood pressure. ? Don?t smoke or drink caffeinated beverages for at least 30 minutes before. ?Take your blood pressure before (not after) you eat. ?Sit comfortably with your back supported and both feet on the floor (don?t cross your legs). ?Elevate your arm to heart level on a table or a desk. ?Use the proper sized cuff. It should fit smoothly and snugly around your bare upper arm. There should be enough room to slip a fingertip under the cuff. The bottom edge of the cuff should be 1 inch above the crease of the elbow. ?Ideally, take 3 measurements at one sitting and record the average. ? ? ?

## 2021-05-14 NOTE — Progress Notes (Signed)
? ? ? ?05/14/2021 ?Robert Haney ?04-Mar-1970 ?[id number] ? ? ?HPI:  Robert Haney is a 51 y.o. male patient of Dr Duke Salvia, with a PMH below who presents today for hypertension clinic evaluation.  He was seen by Dr. Duke Salvia in December after being referred to cardiology for paroxysmal AF.  His pressure in the office that day was 140/86, and he noted that home readings were similar.  Dr. Duke Salvia asked that he discontinue using ginseng and added doxazosin 2 mg nightly to his irbesartan and amlodipine.  He was asked to monitor home readings and follow up after 2 months.   On follow up, his pressure was unchanged, at 146/84.  I increased the doxazosin to 4 mg nightly and asked that he continue with home monitoring. ? ?Today he returns for follow up.  States no problems with compliance or getting his medications.  Did not check his BP at home since his last visit.   He does note that he had to use the 30 mg diltiazem twice in the past month for palpitations/tachycardia.   ?  ?Past Medical History: ?hyperlipidemia 8/22 LDL 86 on atorvastatin 10  ?AF Paroxysmal, has diltiazem for prn use, CHADS2-VASc score of 1, not currently on anticoagulation  ?OSA   ?  ?Blood Pressure Goal:  130/80 ? ?Current Medications: irbesartan 300 mg qhs, amlodipine 10 mg qam, doxazosin 4 mg qhs ? ?Family Hx: both parents with hypertension, mgm also; mother now 29 with BP controlled; father died at 8 kidney disease; 2 sisters with hypertension; daughter 36 with thyroid cancer ? ?Social Hx: no tobacco, occasional alcohol, coffee daily ? ?Diet: mix of home and eating out, out is mix of fast food, sit down; no added salt; some vegetables, mix of meats; some snacking ? ?Exercise: mix of cardio and weights, 20-30 min of each per workout - 2-3 times each week  ? ?Home BP readings:  no readings with him today - did not check over last month ? ?Intolerances: hctz - headaches ? ?Labs: 9/22 - Na 140, K  3.8, Glu 117, BUN 17, SCr 1.95, GFR  41 ? ? ?Wt Readings from Last 3 Encounters:  ?03/12/21 243 lb (110.2 kg)  ?01/09/21 238 lb 6.4 oz (108.1 kg)  ?11/23/20 245 lb 3.2 oz (111.2 kg)  ? ?BP Readings from Last 3 Encounters:  ?05/14/21 132/78  ?03/12/21 (!) 146/84  ?01/09/21 140/86  ? ?Pulse Readings from Last 3 Encounters:  ?05/14/21 (!) 56  ?03/12/21 62  ?01/09/21 61  ? ? ?Current Outpatient Medications  ?Medication Sig Dispense Refill  ? acetaminophen (TYLENOL) 650 MG CR tablet Take 650 mg by mouth 2 (two) times daily as needed for pain.    ? amLODipine (NORVASC) 10 MG tablet Take 10 mg by mouth daily.    ? atorvastatin (LIPITOR) 10 MG tablet Take 10 mg by mouth every evening.    ? cyclobenzaprine (FLEXERIL) 10 MG tablet Take 10 mg by mouth 3 (three) times daily as needed for muscle spasms.  0  ? diltiazem (CARDIZEM) 30 MG tablet Take 1 tablet every 4 hours AS NEEDED for heart rate >100 30 tablet 1  ? DiphenhydrAMINE HCl (BENADRYL PO) Take 25 mg by mouth 2 (two) times daily as needed (allergies).    ? doxazosin (CARDURA) 4 MG tablet Take 1 tablet (4 mg total) by mouth daily. 90 tablet 3  ? FARXIGA 10 MG TABS tablet Take 10 mg by mouth every morning.    ? irbesartan (AVAPRO) 300 MG tablet Take  300 mg by mouth at bedtime.    ? Ranitidine HCl (ZANTAC PO) Take 1 tablet by mouth daily. Zantac 150 MG Tablet 1 tablet every other day    ? ?No current facility-administered medications for this visit.  ? ? ?Allergies  ?Allergen Reactions  ? Hydrochlorothiazide   ?  Other reaction(s): headaches  ? ? ?Past Medical History:  ?Diagnosis Date  ? Elevated BP   ? elevated BP readings-2010-------- normalized when rechecked 12/01/08  ? Elevated LDL cholesterol level   ?  (normal TSH 2008)  ? Essential hypertension 09/27/2015  ? Hyperlipidemia 09/27/2015  ? Hypertension, essential   ? Leukocytopenia   ? Mild leukocytopenia- work up including Bone Marrow bx done, normal variant- Dr. Penelope Galas [facility] 8/11  ? Low testosterone   ? (symptomatic)--2.60 03/2008  ?  Morbid obesity ([facility]) 09/27/2015  ? Murmur 09/27/2015  ? Overweight   ? Palpitations 09/27/2015  ? ? ?Blood pressure 132/78, pulse (!) 56, height 5' 2.5" (1.588 m). ? ?Essential hypertension ?Patient with essential hypertension, now doing well on current combination of irbesartan, amlodipine and doxazosin.  Will have him continue with these and monitor pressure at home 2-3 times per week.  He is scheduled to see Dr. Duke Salvia in June and we can see him after that as needed.  ? ?Phillips Hay PharmD CPP [facility] ?[facility] ?[address] ?[place], [place] [address] ?[phone] ?

## 2021-05-14 NOTE — Assessment & Plan Note (Signed)
Patient with essential hypertension, now doing well on current combination of irbesartan, amlodipine and doxazosin.  Will have him continue with these and monitor pressure at home 2-3 times per week.  He is scheduled to see Dr. Duke Salvia in June and we can see him after that as needed.  ?

## 2021-07-10 ENCOUNTER — Ambulatory Visit (HOSPITAL_BASED_OUTPATIENT_CLINIC_OR_DEPARTMENT_OTHER): Payer: 59 | Admitting: Cardiovascular Disease

## 2021-12-10 ENCOUNTER — Telehealth (HOSPITAL_BASED_OUTPATIENT_CLINIC_OR_DEPARTMENT_OTHER): Payer: Self-pay | Admitting: *Deleted

## 2021-12-10 DIAGNOSIS — I10 Essential (primary) hypertension: Secondary | ICD-10-CM

## 2021-12-10 DIAGNOSIS — I480 Paroxysmal atrial fibrillation: Secondary | ICD-10-CM

## 2021-12-10 DIAGNOSIS — E785 Hyperlipidemia, unspecified: Secondary | ICD-10-CM

## 2021-12-10 NOTE — Telephone Encounter (Signed)
Patient dropped off papers needed [facility] letter Per Dr Oval Linsey patient needs GXT before letter can be done Advised patient, order placed and message to scheduling to arrange

## 2021-12-18 ENCOUNTER — Telehealth (HOSPITAL_COMMUNITY): Payer: Self-pay | Admitting: *Deleted

## 2021-12-18 NOTE — Telephone Encounter (Signed)
Patient given detailed instructions per Stress Test Requisition Sheet for test on 12/23/2021 at 3:30.Patient Notified to arrive 15 minutes early, and that it is imperative to arrive on time for appointment to keep from having the test rescheduled.  Patient verbalized understanding. Daneil Dolin

## 2021-12-23 ENCOUNTER — Ambulatory Visit (HOSPITAL_COMMUNITY): Payer: 59

## 2022-01-01 ENCOUNTER — Ambulatory Visit (HOSPITAL_COMMUNITY): Payer: 59 | Attending: Cardiovascular Disease

## 2022-01-01 DIAGNOSIS — I480 Paroxysmal atrial fibrillation: Secondary | ICD-10-CM | POA: Diagnosis not present

## 2022-01-01 DIAGNOSIS — I10 Essential (primary) hypertension: Secondary | ICD-10-CM | POA: Diagnosis present

## 2022-01-01 DIAGNOSIS — E785 Hyperlipidemia, unspecified: Secondary | ICD-10-CM | POA: Diagnosis present

## 2022-01-01 LAB — EXERCISE TOLERANCE TEST
Angina Index: 0
Duke Treadmill Score: 8
Estimated workload: 10.1
Exercise duration (min): 8 min
MPHR: 167 {beats}/min
Peak HR: 151 {beats}/min
Percent HR: 90 %
Rest HR: 64 {beats}/min
ST Depression (mm): 0 mm

## 2022-02-24 NOTE — Telephone Encounter (Signed)
--  late entry--  Faxed 12/7, confirmation received  Mailed copy to patient

## 2022-03-07 ENCOUNTER — Other Ambulatory Visit (HOSPITAL_BASED_OUTPATIENT_CLINIC_OR_DEPARTMENT_OTHER): Payer: Self-pay | Admitting: Cardiovascular Disease

## 2022-03-07 ENCOUNTER — Other Ambulatory Visit: Payer: Self-pay

## 2022-03-07 DIAGNOSIS — R0609 Other forms of dyspnea: Secondary | ICD-10-CM

## 2022-03-11 ENCOUNTER — Ambulatory Visit (HOSPITAL_BASED_OUTPATIENT_CLINIC_OR_DEPARTMENT_OTHER): Payer: 59 | Admitting: Internal Medicine

## 2022-03-11 DIAGNOSIS — R0609 Other forms of dyspnea: Secondary | ICD-10-CM

## 2022-03-11 LAB — PULMONARY FUNCTION TEST
DL/VA % pred: 162 %
DL/VA: 7.35 ml/min/mmHg/L
DLCO cor % pred: 118 %
DLCO cor: 26.74 ml/min/mmHg
DLCO unc % pred: 118 %
DLCO unc: 26.74 ml/min/mmHg
FEF 25-75 Post: 4.4 L/s
FEF 25-75 Pre: 3.94 L/s
FEF2575-%Change-Post: 11 %
FEF2575-%Pred-Post: 165 %
FEF2575-%Pred-Pre: 147 %
FEV1-%Change-Post: 3 %
FEV1-%Pred-Post: 77 %
FEV1-%Pred-Pre: 75 %
FEV1-Post: 2.27 L
FEV1-Pre: 2.2 L
FEV1FVC-%Change-Post: 3 %
FEV1FVC-%Pred-Pre: 116 %
FEV6-%Change-Post: 0 %
FEV6-%Pred-Post: 67 %
FEV6-%Pred-Pre: 67 %
FEV6-Post: 2.43 L
FEV6-Pre: 2.45 L
FEV6FVC-%Change-Post: 0 %
FEV6FVC-%Pred-Post: 104 %
FEV6FVC-%Pred-Pre: 104 %
FVC-%Change-Post: 0 %
FVC-%Pred-Post: 64 %
FVC-%Pred-Pre: 64 %
FVC-Post: 2.43 L
FVC-Pre: 2.45 L
Post FEV1/FVC ratio: 93 %
Post FEV6/FVC ratio: 100 %
Pre FEV1/FVC ratio: 90 %
Pre FEV6/FVC Ratio: 100 %
RV % pred: 94 %
RV: 1.6 L
TLC % pred: 78 %
TLC: 4.26 L

## 2022-03-11 NOTE — Patient Instructions (Signed)
Full PFT Performed Today. 

## 2022-03-11 NOTE — Progress Notes (Signed)
Full PFT Performed Today. 

## 2022-03-14 ENCOUNTER — Other Ambulatory Visit: Payer: Self-pay | Admitting: Family Medicine

## 2022-03-14 ENCOUNTER — Ambulatory Visit
Admission: RE | Admit: 2022-03-14 | Discharge: 2022-03-14 | Disposition: A | Discharge status: Home / Self Care | Payer: 59 | Source: Ambulatory Visit | Attending: Family Medicine | Admitting: Family Medicine

## 2022-03-14 DIAGNOSIS — R053 Chronic cough: Secondary | ICD-10-CM

## 2022-04-11 ENCOUNTER — Other Ambulatory Visit: Payer: Self-pay | Admitting: Cardiovascular Disease

## 2022-04-11 NOTE — Telephone Encounter (Signed)
Left message for patient to call and schedule overdue follow up  with Dr.  / APP for medication refills 

## 2022-04-11 NOTE — Telephone Encounter (Signed)
Pt last seen 01/2021. Please call to schedule overdue follow-up with Dr. Oval Linsey or APP for refills. Looks like pt was scheduled for 07/2021 but appt changed d/t provider schedule change. Does not look like he rescheduled.

## 2022-04-18 NOTE — Telephone Encounter (Signed)
Scheduled 04/28/22 with Laurann Montana, NP

## 2022-04-21 NOTE — Telephone Encounter (Signed)
Rx request sent to pharmacy.  

## 2022-04-28 ENCOUNTER — Encounter (HOSPITAL_BASED_OUTPATIENT_CLINIC_OR_DEPARTMENT_OTHER): Payer: Self-pay | Admitting: Family

## 2022-04-28 ENCOUNTER — Ambulatory Visit (HOSPITAL_BASED_OUTPATIENT_CLINIC_OR_DEPARTMENT_OTHER): Payer: 59 | Admitting: Family

## 2022-04-28 VITALS — BP 118/76 | HR 59 | Ht 62.5 in | Wt 244.0 lb

## 2022-04-28 DIAGNOSIS — E785 Hyperlipidemia, unspecified: Secondary | ICD-10-CM | POA: Diagnosis not present

## 2022-04-28 DIAGNOSIS — I10 Essential (primary) hypertension: Secondary | ICD-10-CM | POA: Diagnosis not present

## 2022-04-28 DIAGNOSIS — I480 Paroxysmal atrial fibrillation: Secondary | ICD-10-CM

## 2022-04-28 MED ORDER — DOXAZOSIN MESYLATE 4 MG PO TABS: 4.0000 mg | ORAL_TABLET | Freq: Every day | ORAL | 3 refills | Status: DC

## 2022-04-28 MED ORDER — DILTIAZEM HCL 30 MG PO TABS: ORAL_TABLET | ORAL | 1 refills | Status: AC

## 2022-04-28 NOTE — Patient Instructions (Addendum)
Medication Instructions:  Continue your current medications.  *If you need a refill on your cardiac medications before your next appointment, please call your pharmacy*  Follow-Up: At [facility], you and your health needs are our priority.  As part of our continuing mission to provide you with exceptional heart care, we have created designated Provider Care Teams.  These Care Teams include your primary Cardiologist (physician) and Advanced Practice Providers (APPs -  Physician Assistants and Nurse Practitioners) who all work together to provide you with the care you need, when you need it.  We recommend signing up for the patient portal called "MyChart".  Sign up information is provided on this After Visit Summary.  MyChart is used to connect with patients for Virtual Visits (Telemedicine).  Patients are able to view lab/test results, encounter notes, upcoming appointments, etc.  Non-urgent messages can be sent to your provider as well.   To learn more about what you can do with MyChart, go to [email or web address].    Your next appointment:   1 year(s)  Provider:   Skeet Latch, MD    Other Instructions  Heart Healthy Diet Recommendations: A low-salt diet is recommended. Meats should be grilled, baked, or boiled. Avoid fried foods. Focus on lean protein sources like fish or chicken with vegetables and fruits. The American Heart Association is a Microbiologist!  American Heart Association Diet and Lifeystyle Recommendations   Exercise recommendations: The American Heart Association recommends 150 minutes of moderate intensity exercise weekly. Try 30 minutes of moderate intensity exercise 4-5 times per week. This could include walking, jogging, or swimming.

## 2022-04-28 NOTE — Progress Notes (Signed)
Office Visit    Patient Name: Robert Haney Date of Encounter: 04/28/2022  PCP:  Lujean Amel, [place]  Cardiologist:  Skeet Latch, MD  Advanced Practice Provider:  No care team member to display Electrophysiologist:  None      Chief Complaint    Robert Haney is a 52 y.o. male presents today for hypertension follow up   Past Medical History    Past Medical History:  Diagnosis Date   Elevated BP    elevated BP readings-2010-------- normalized when rechecked 12/01/08   Elevated LDL cholesterol level     (normal TSH 2008)   Essential hypertension 09/27/2015   Hyperlipidemia 09/27/2015   Hypertension, essential    Leukocytopenia    Mild leukocytopenia- work up including Bone Marrow bx done, normal variant- Dr. Janie [facility] 8/11   Low testosterone    (symptomatic)--2.60 03/2008   Morbid obesity ([place]) 09/27/2015   Murmur 09/27/2015   Overweight    Palpitations 09/27/2015   Past Surgical History:  Procedure Laterality Date   KNEE SURGERY  1994   right knee    WRIST SURGERY  1993   left wrist     Allergies  Allergies  Allergen Reactions   Hydrochlorothiazide     Other reaction(s): headaches    History of Present Illness    Robert Haney is a 52 y.o. male with a hx of atrial fibrillation, hypertension, hyperlipidemia, obesity, OSA with CPAP last seen 05/14/21.  Initially diagnosed with atrial fibrillation in September 2022 after presenting to the ED with palpitations and diaphoresis.  Hearted on diltiazem and converted in the ED.  He was not started on anticoagulation due to CHADS2 score of 1.  Echo 11/2020 LVEF 55-60%.  He saw Dr. Oval Linsey.  7/22 noting palpitations, bilateral lower extremity swelling, elevated blood pressure.  Irbesartan and amlodipine were continued.  He was recommended to stop over-the-counter ginseng.  Doxazosin 2 mg nightly added.  He was hesitant regarding diuretic.  At  follow-up with pharmacy team 03/12/2021 doxazosin increased to 4 mg nightly.  At follow-up 05/14/2021 his blood pressure was well-controlled.  Presents today for follow up independently. Reports no shortness of breath nor dyspnea on exertion. Exercising by walking on treadmill or elliptical for about 150 minutes per week. Stays active working as Airline pilot.  BP when checked at home 120s-140s over 80s.  Reports no chest pain, pressure, or tightness. No edema, orthopnea, PND. Reports rare palpitations most often with stress. which often resolve on their own but occasionally uses PRN Diltiazem. Reports wearing CPAP every night.  He had 1 episode of lightheadedness that occurred with bending over and standing up quickly which has not recurred.  No near-syncope, syncope.  EKGs/Labs/Other Studies Reviewed:   The following studies were reviewed today:  Cardiac Studies & Procedures     STRESS TESTS  EXERCISE TOLERANCE TEST (ETT) 01/01/2022  Narrative   No ST deviation was noted.  ETT with good exercise tolerance (8:00); no CP; normal BP response; no ST changes; negative adequate ETT.   ECHOCARDIOGRAM  ECHOCARDIOGRAM COMPLETE 11/21/2020  Narrative ECHOCARDIOGRAM REPORT    Patient Name:   Robert Haney Date of Exam: 11/21/2020 Medical Rec #:  [id number]         Height:       62.0 in Accession #:    [id number]        Weight:       237.0 lb Date of Birth:  Sep 07, 1970  BSA:          2.055 m Patient Age:    27 years          BP:           171/98 mmHg Patient Gender: M                 HR:           60 bpm. Exam Location:  Outpatient  Procedure: 2D Echo, 3D Echo, Color Doppler, Cardiac Doppler and Strain Analysis  Indications:    I48.91* Unspecified atrial fibrillation  History:        Patient has prior history of Echocardiogram examinations, most recent 10/09/2015. Arrythmias:Atrial Fibrillation; Risk Factors:Hypertension and Dyslipidemia.  Sonographer:    Raquel Sarna Senior  RDCS Referring Phys: [id number] [place]   1. Left ventricular ejection fraction, by estimation, is 55 to 60%. Left ventricular ejection fraction by 3D volume is 57 %. The left ventricle has normal function. The left ventricle has no regional wall motion abnormalities. Left ventricular diastolic parameters were normal. The average left ventricular global longitudinal strain is -21.8 %. The global longitudinal strain is normal. 2. Right ventricular systolic function is normal. The right ventricular size is normal. Tricuspid regurgitation signal is inadequate for assessing PA pressure. 3. The mitral valve is normal in structure. Trivial mitral valve regurgitation. No evidence of mitral stenosis. 4. The aortic valve is normal in structure. Aortic valve regurgitation is not visualized. No aortic stenosis is present. 5. The inferior vena cava is normal in size with greater than 50% respiratory variability, suggesting right atrial pressure of 3 mmHg.  FINDINGS Left Ventricle: Left ventricular ejection fraction, by estimation, is 55 to 60%. Left ventricular ejection fraction by 3D volume is 57 %. The left ventricle has normal function. The left ventricle has no regional wall motion abnormalities. The average left ventricular global longitudinal strain is -21.8 %. The global longitudinal strain is normal. The left ventricular internal cavity size was normal in size. There is no left ventricular hypertrophy. Left ventricular diastolic parameters were normal. Normal left ventricular filling pressure.  Right Ventricle: The right ventricular size is normal. No increase in right ventricular wall thickness. Right ventricular systolic function is normal. Tricuspid regurgitation signal is inadequate for assessing PA pressure.  Left Atrium: Left atrial size was normal in size.  Right Atrium: Right atrial size was normal in size.  Pericardium: There is no evidence of pericardial  effusion.  Mitral Valve: The mitral valve is normal in structure. Trivial mitral valve regurgitation. No evidence of mitral valve stenosis.  Tricuspid Valve: The tricuspid valve is normal in structure. Tricuspid valve regurgitation is not demonstrated. No evidence of tricuspid stenosis.  Aortic Valve: The aortic valve is normal in structure. Aortic valve regurgitation is not visualized. No aortic stenosis is present.  Pulmonic Valve: The pulmonic valve was normal in structure. Pulmonic valve regurgitation is trivial. No evidence of pulmonic stenosis.  Aorta: The aortic root is normal in size and structure.  Venous: The inferior vena cava is normal in size with greater than 50% respiratory variability, suggesting right atrial pressure of 3 mmHg.  IAS/Shunts: No atrial level shunt detected by color flow Doppler.   LEFT VENTRICLE PLAX 2D LVIDd:         4.90 cm         Diastology LVIDs:         3.10 cm         LV e' medial:  10.00 cm/s LV PW:         0.90 cm         LV E/e' medial:  8.1 LV IVS:        0.90 cm         LV e' lateral:   15.30 cm/s LVOT diam:     2.10 cm         LV E/e' lateral: 5.3 LV SV:         74 LV SV Index:   36              2D LVOT Area:     3.46 cm        Longitudinal Strain 2D Strain GLS  -22.1 % (A2C): 2D Strain GLS  -21.2 % (A3C): 2D Strain GLS  -22.0 % (A4C): 2D Strain GLS  -21.8 % Avg:  3D Volume EF LV 3D EF:    Left ventricul ar ejection fraction by 3D volume is 57 %.  3D Volume EF: 3D EF:        57 % LV EDV:       172 ml LV ESV:       73 ml LV SV:        99 ml  RIGHT VENTRICLE RV S prime:     18.20 cm/s TAPSE (M-mode): 2.6 cm  LEFT ATRIUM             Index        RIGHT ATRIUM          Index LA diam:        2.60 cm 1.27 cm/m   RA Area:     9.75 cm LA Vol (A2C):   41.6 ml 20.25 ml/m  RA Volume:   18.40 ml 8.96 ml/m LA Vol (A4C):   40.7 ml 19.81 ml/m LA Biplane Vol: 43.9 ml 21.37 ml/m AORTIC VALVE LVOT Vmax:   110.00  cm/s LVOT Vmean:  70.600 cm/s LVOT VTI:    0.215 m  AORTA Ao Root diam: 3.20 cm Ao Asc diam:  3.50 cm  MITRAL VALVE MV Area (PHT): 3.63 cm    SHUNTS MV Decel Time: 209 msec    Systemic VTI:  0.22 m MV E velocity: 80.70 cm/s  Systemic Diam: 2.10 cm MV A velocity: 52.90 cm/s MV E/A ratio:  1.53  Fransico Him MD Electronically signed by Fransico Him MD Signature Date/Time: 11/21/2020/12:01:17 PM    Final    MONITORS  CARDIAC EVENT MONITOR 11/09/2015  Narrative 30 Day Event Monitor  Quality: Fair.  Baseline artifact. Predominant rhythm: sinus rhythm, sinus bradycardia.  Max heart rate: 110 bpm Min heart rate:40 bpm  Occasional PVCs   Tiffany C. Oval Linsey, MD, [facility] 11/09/2015 6:47 PM            EKG:  EKG is ordered today.  EKG performed today demonstrates sinus bradycardia 46 bpm with no acute ST/GU changes.  Recent Labs: No results found for requested labs within last 365 days.  Recent Lipid Panel No results found for: "CHOL", "TRIG", "HDL", "CHOLHDL", "VLDL", "LDLCALC", "LDLDIRECT"  Risk Assessment/Calculations:   CHA2DS2-VASc Score = 1   This indicates a 0.6% annual risk of stroke. The patient's score is based upon: CHF History: 0 HTN History: 1 Diabetes History: 0 Stroke History: 0 Vascular Disease History: 0 Age Score: 0 Gender Score: 0      Home Medications   Current Meds  Medication Sig   acetaminophen (TYLENOL) 650 MG CR tablet Take  650 mg by mouth 2 (two) times daily as needed for pain.   amLODipine (NORVASC) 10 MG tablet Take 10 mg by mouth daily.   atorvastatin (LIPITOR) 10 MG tablet Take 10 mg by mouth every evening.   cyclobenzaprine (FLEXERIL) 10 MG tablet Take 10 mg by mouth 3 (three) times daily as needed for muscle spasms.   DiphenhydrAMINE HCl (BENADRYL PO) Take 25 mg by mouth 2 (two) times daily as needed (allergies).   famotidine (PEPCID AC) 10 MG tablet Take 10 mg by mouth daily.   FARXIGA 10 MG TABS tablet Take 10 mg by mouth  every morning.   irbesartan (AVAPRO) 300 MG tablet Take 300 mg by mouth at bedtime.   Ranitidine HCl (ZANTAC PO) Take 1 tablet by mouth daily. Zantac 150 MG Tablet 1 tablet every other day   spironolactone (ALDACTONE) 25 MG tablet Take 25 mg by mouth daily.   UNABLE TO FIND CPAP   [DISCONTINUED] diltiazem (CARDIZEM) 30 MG tablet Take 1 tablet every 4 hours AS NEEDED for heart rate >100   [DISCONTINUED] doxazosin (CARDURA) 4 MG tablet Take 1 tablet (4 mg total) by mouth daily. Please keep your upcoming appointment for refills.     Review of Systems      All other systems reviewed and are otherwise negative except as noted above.  Physical Exam    VS:  BP 118/76   Pulse (!) 59   Ht 5' 2.5" (1.588 m)   Wt 244 lb (110.7 kg)   BMI 43.92 kg/m  , BMI Body mass index is 43.92 kg/m.  Wt Readings from Last 3 Encounters:  04/28/22 244 lb (110.7 kg)  03/12/21 243 lb (110.2 kg)  01/09/21 238 lb 6.4 oz (108.1 kg)     GEN: Well nourished, overweight, well developed, in no acute distress. HEENT: normal. Neck: Supple, no JVD, carotid bruits, or masses. Cardiac: RRR, no murmurs, rubs, or gallops. No clubbing, cyanosis, edema.  Radials/PT 2+ and equal bilaterally.  Respiratory:  Respirations regular and unlabored, clear to auscultation bilaterally. GI: Soft, nontender, nondistended. MS: No deformity or atrophy. Skin: Warm and dry, no rash. Neuro:  Strength and sensation are intact. Psych: Normal affect.  Assessment & Plan    HTN - BP well controlled. Continue current antihypertensive regimen of amlodipine 10 mg daily, doxazosin 4 mg nightly, irbesartan 300 mg daily, spironolactone 25 mg daily.Marland Kitchen  Refills provided today.  Relatively hypotensive in clinic today but asymptomatic with no lightheadedness dizziness.  Reports BP at home often 120s-140s over 80s.  CKD - Appreciate inclusion of SLGT2i by Dr. Joelyn Oms of nephrology.   PAF / Palpitations - Quiescent. EKG today SB with no acute ST/T  wave changes. Asymptomatic with no lightheadedness, dizziness. Encouraged to continue to use CPAP. No indication for further workup at this time.   HLD - Continue Atorvastatin.          Disposition: Follow up in 1 year(s) with Skeet Latch, MD or APP.  Signed, Loel Dubonnet, NP 04/28/2022, 11:29 AM [place]

## 2022-08-28 ENCOUNTER — Ambulatory Visit: Payer: 59

## 2022-08-28 ENCOUNTER — Encounter: Payer: Self-pay | Admitting: Podiatry

## 2022-08-28 ENCOUNTER — Ambulatory Visit: Payer: 59 | Admitting: Podiatry

## 2022-08-28 DIAGNOSIS — M79671 Pain in right foot: Secondary | ICD-10-CM | POA: Diagnosis not present

## 2022-08-28 DIAGNOSIS — M722 Plantar fascial fibromatosis: Secondary | ICD-10-CM

## 2022-08-28 MED ORDER — MELOXICAM 7.5 MG PO TABS: 7.5000 mg | ORAL_TABLET | Freq: Every day | ORAL | 0 refills | Status: DC

## 2022-08-28 NOTE — Patient Instructions (Signed)

## 2022-08-28 NOTE — Progress Notes (Signed)
Chief Complaint  Patient presents with   Plantar Fasciitis    Patient came in today for right heel pain, started 3 weeks ago, rate of pain 1 out of 10, morning pain,  X-Rays done today    HPI: 52 y.o. male presenting today with c/o pain in the bottom of the right heel.  Patient is a Company secretary and noticed this approximately 2 days after carrying left shoulder up to a steep incline.  Denies bruising to the area.  Did not feel a snap or popping sensation.  Past Medical History:  Diagnosis Date   Elevated BP    elevated BP readings-2010-------- normalized when rechecked 12/01/08   Elevated LDL cholesterol level     (normal TSH 2008)   Essential hypertension 09/27/2015   Hyperlipidemia 09/27/2015   Hypertension, essential    Leukocytopenia    Mild leukocytopenia- work up including Bone Marrow bx done, normal variant- Dr. Penelope Galas [facility] 8/11   Low testosterone    (symptomatic)--2.60 03/2008   Morbid obesity ([facility]) 09/27/2015   Murmur 09/27/2015   Overweight    Palpitations 09/27/2015    Past Surgical History:  Procedure Laterality Date   KNEE SURGERY  1994   right knee    WRIST SURGERY  1993   left wrist     Allergies  Allergen Reactions   Hydrochlorothiazide     Other reaction(s): headaches     Physical Exam: General: The patient is alert and oriented x3 in no acute distress.  Dermatology:  No ecchymosis, erythema, or edema bilateral.  No open lesions.    Vascular: Palpable pedal pulses bilaterally. Capillary refill within normal limits.  No appreciable edema.    Neurological: Light touch sensation intact bilateral.  MMT 5/5 to lower extremity bilateral. Negative Tinel's sign with percussion of the posterior tibial nerve on the affected extremity.    Musculoskeletal Exam:  There is pain on palpation of the plantarmedial & plantarcentral aspect of right heel and proximal portion of the medial and central plantar fascial bands.  No gaps or nodules within the  plantar fascia.  Positive Windlass mechanism bilateral.  Antalgic gait noted with first few steps upon standing.  No pain on palpation of achilles tendon bilateral.  Ankle df less than 10 degrees with knee extended b/l.  Radiographic Exam (right foot, 3 weightbearing views, 08/28/2022):  Normal osseous mineralization. Joint spaces preserved.  Slight increase in hallux abductus interphalangeus angle.  Posterior calcaneal spur present.  No fracture or seen.    Assessment/Plan of Care: 1. Right foot pain   2. Plantar fasciitis of right foot     Meds ordered this encounter  Medications   meloxicam (MOBIC) 7.5 MG tablet    Sig: Take 1 tablet (7.5 mg total) by mouth daily.    Dispense:  30 tablet    Refill:  0   FOR HOME USE ONLY DME [employer]  -Reviewed etiology of plantar fasciitis with patient.  Discussed treatment options with patient today, including cortisone injection, NSAID course of treatment, stretching exercises, physical therapy, use of night splint, rest, icing the heel, arch supports/orthotics, and supportive shoe gear.    Patient was fitted for a posterior night splint to wear on the right foot when sleeping.  This is a prefabricated off-the-shelf AFO to be worn at all times nonweightbearing i.e. when sleeping, with a soft interface material.  Prescription for meloxicam 7.5 mg sent to his pharmacy to take 1 tablet daily.  The patient was  casted for orthotics today.  States it has been a while since he has had a new pair.  Notes his insurance for 1 pair every other year.  Patient noted after we were finished with his appointment that he is already on light duty and is wondering if he can continue with his restrictions.  This was cleared for the front desk to give him a work note for another 2 weeks.  F/u 2 weeks with Dr. Alma Downs, DPM, FACFAS [facility]     [address] [place], [place] [address]                 Office [phone]  Fax [phone]

## 2022-09-10 IMAGING — DX DG CHEST 1V PORT
1 series · 1 of 1 positions shown · non-contrast
Comparison: None.

CLINICAL DATA: Chest pain

EXAM:
PORTABLE CHEST 1 VIEW

[chest]
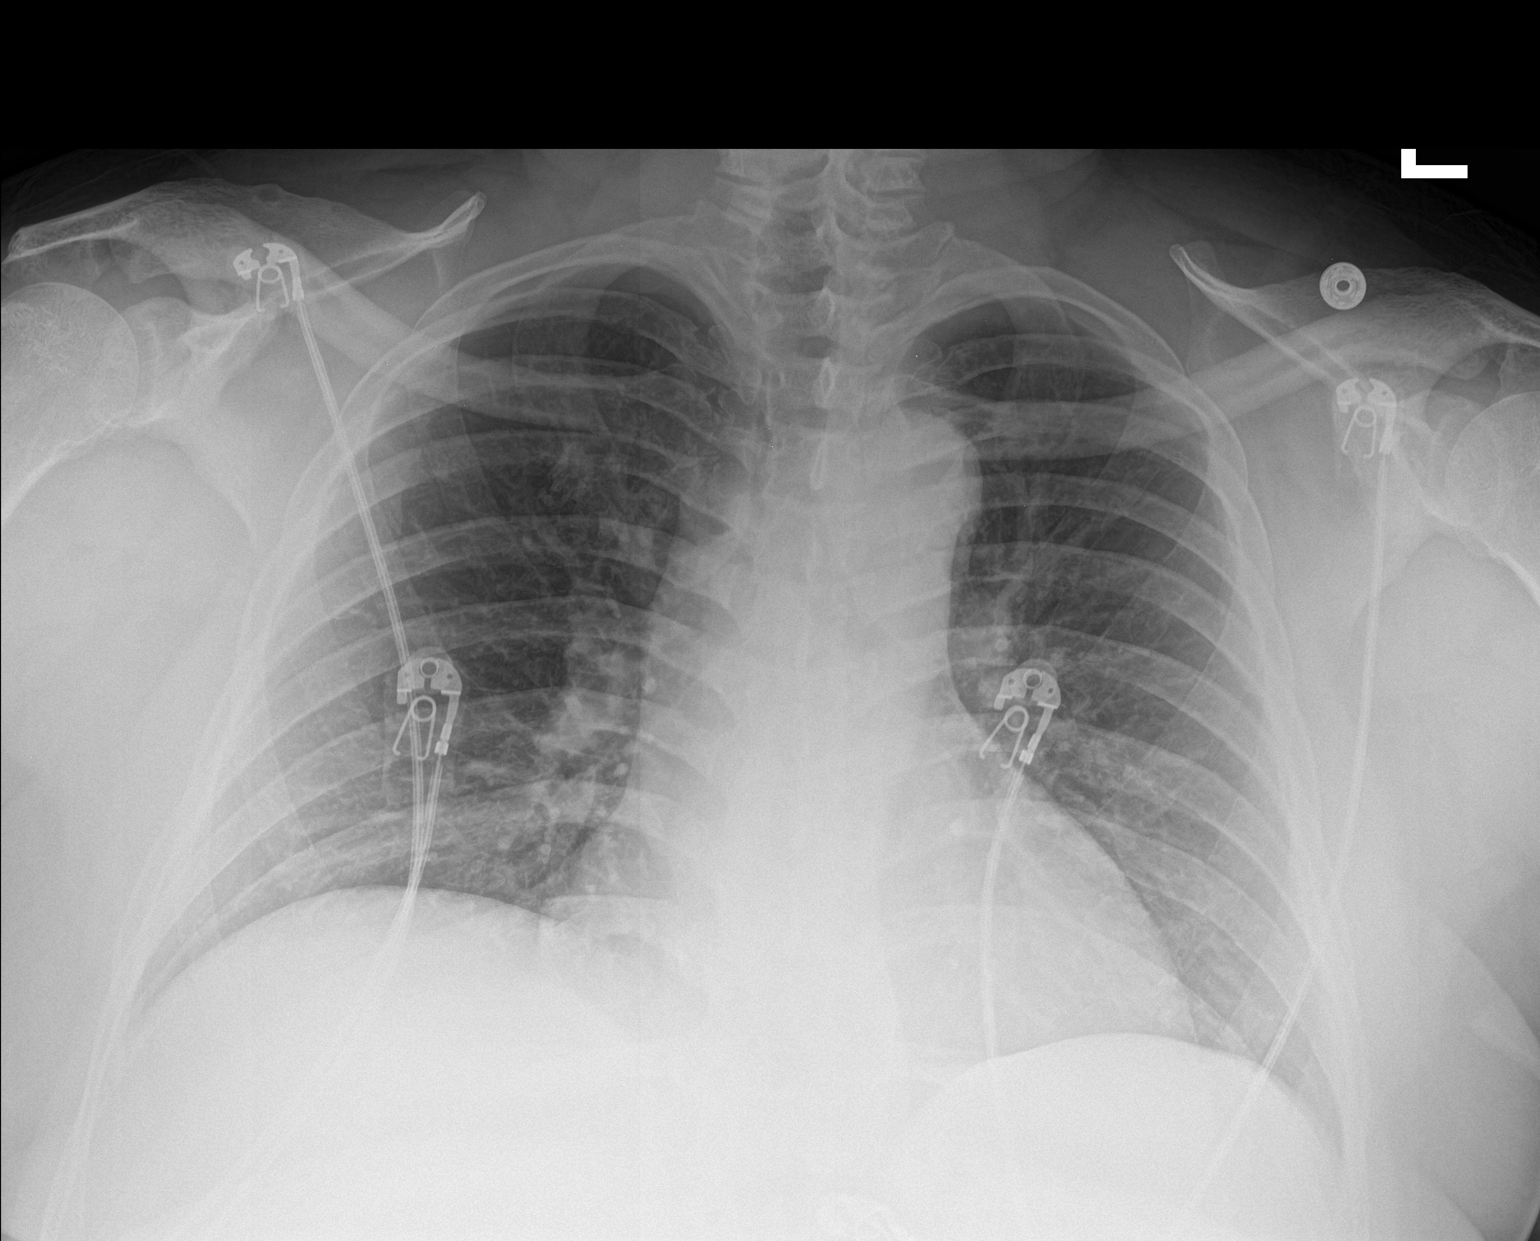

[1 of 1 positions shown; findings below may reference images not displayed]

FINDINGS: The heart size and mediastinal contours are within normal limits.
Both lungs are clear. The visualized skeletal structures are
unremarkable.
IMPRESSION: No active disease.

## 2022-09-23 ENCOUNTER — Ambulatory Visit: Payer: 59 | Admitting: Podiatry

## 2022-09-23 ENCOUNTER — Encounter: Payer: Self-pay | Admitting: Podiatry

## 2022-09-23 DIAGNOSIS — N289 Disorder of kidney and ureter, unspecified: Secondary | ICD-10-CM | POA: Insufficient documentation

## 2022-09-23 DIAGNOSIS — M722 Plantar fascial fibromatosis: Secondary | ICD-10-CM | POA: Diagnosis not present

## 2022-09-23 NOTE — Progress Notes (Signed)
He presents today for follow-up of his [place] fasciitis right he states that is 90% improved.  He is also able to pick up his orthotics today.  Objective: Vital signs are stable he is alert and oriented x 3.  Pulses are unchanged physical exam has improved considerably no pain.  Assessment: Resolution of symptomatology nearly no pain on palpation medial calcaneal tubercle.  Plan: Orthotics were dispensed with both oral written home-going instructions and he was given a note to go back to work full duty.

## 2022-10-10 ENCOUNTER — Other Ambulatory Visit: Payer: Self-pay | Admitting: Podiatry

## 2022-10-10 DIAGNOSIS — M722 Plantar fascial fibromatosis: Secondary | ICD-10-CM

## 2022-10-13 ENCOUNTER — Telehealth (HOSPITAL_BASED_OUTPATIENT_CLINIC_OR_DEPARTMENT_OTHER): Payer: Self-pay | Admitting: Cardiovascular Disease

## 2022-10-13 NOTE — Telephone Encounter (Signed)
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 I have left form in [place] [facility]

## 2022-10-15 NOTE — Telephone Encounter (Signed)
Advised patient at front ready for pick up, verbalized understanding

## 2022-11-10 ENCOUNTER — Other Ambulatory Visit: Payer: Self-pay | Admitting: Podiatry

## 2022-11-10 DIAGNOSIS — M722 Plantar fascial fibromatosis: Secondary | ICD-10-CM

## 2022-12-18 ENCOUNTER — Other Ambulatory Visit: Payer: Self-pay | Admitting: Podiatry

## 2022-12-18 DIAGNOSIS — M722 Plantar fascial fibromatosis: Secondary | ICD-10-CM

## 2023-05-01 ENCOUNTER — Other Ambulatory Visit (HOSPITAL_BASED_OUTPATIENT_CLINIC_OR_DEPARTMENT_OTHER): Payer: Self-pay | Admitting: Family

## 2023-05-01 DIAGNOSIS — I10 Essential (primary) hypertension: Secondary | ICD-10-CM

## 2023-05-06 ENCOUNTER — Ambulatory Visit
Admission: RE | Admit: 2023-05-06 | Discharge: 2023-05-06 | Disposition: A | Discharge status: Home / Self Care | Source: Ambulatory Visit | Attending: Nurse Practitioner | Admitting: Nurse Practitioner

## 2023-05-06 ENCOUNTER — Other Ambulatory Visit: Payer: Self-pay | Admitting: Nurse Practitioner

## 2023-05-06 DIAGNOSIS — Z0000 Encounter for general adult medical examination without abnormal findings: Secondary | ICD-10-CM

## 2023-11-25 ENCOUNTER — Ambulatory Visit (INDEPENDENT_AMBULATORY_CARE_PROVIDER_SITE_OTHER): Admitting: Orthopaedic Surgery

## 2023-11-25 ENCOUNTER — Ambulatory Visit (HOSPITAL_BASED_OUTPATIENT_CLINIC_OR_DEPARTMENT_OTHER)

## 2023-11-25 DIAGNOSIS — M25561 Pain in right knee: Secondary | ICD-10-CM

## 2023-11-25 DIAGNOSIS — G8929 Other chronic pain: Secondary | ICD-10-CM

## 2023-11-25 NOTE — Progress Notes (Signed)
 Chief Complaint: Right knee pain     History of Present Illness:    Robert Haney is a 53 y.o. male today with ongoing right knee pain for an evaluation for total knee arthroplasty.  He does have a history of A-fib for which he is not on anti-coagulation although that being said his [facility] does not feel comfortable with this type of procedure given his history of A-fib.  He has had multiple injections including 2 rounds of gel in the past for the right knee.  He was previously in the [employer] and was involved with multiple jump exercises.  He does experience significant pain with popping and clicking and does get pain during daily activity with work    PMH/PSH/Family History/Social History/Meds/Allergies:    Past Medical History:  Diagnosis Date   Elevated BP    elevated BP readings-2010-------- normalized when rechecked 12/01/08   Elevated LDL cholesterol level     (normal TSH 2008)   Essential hypertension 09/27/2015   Hyperlipidemia 09/27/2015   Hypertension, essential    Leukocytopenia    Mild leukocytopenia- work up including Bone Marrow bx done, normal variant- Dr. gatha, [facility] 8/11   Low testosterone    (symptomatic)--2.60 03/2008   Morbid obesity ([facility]) 09/27/2015   Murmur 09/27/2015   Overweight    Palpitations 09/27/2015   Past Surgical History:  Procedure Laterality Date   KNEE SURGERY  1994   right knee    WRIST SURGERY  1993   left wrist    Social History   Socioeconomic History   Marital status: Media planner    Spouse name: Not on file   Number of children: 4   Years of education: Not on file   Highest education level: Not on file  Occupational History   Occupation: FIREFIGHTER  Tobacco Use   Smoking status: Never   Smokeless tobacco: Never  Substance and Sexual Activity   Alcohol use: Yes    Alcohol/week: 2.0 standard drinks of alcohol    Types: 1 Cans of beer, 1 Standard drinks or equivalent per week   Drug use: No   Sexual  activity: Not on file  Other Topics Concern   Not on file  Social History Narrative   Not on file   Social Drivers of Health   Financial Resource Strain: Low Risk  (01/09/2021)   Overall Financial Resource Strain (CARDIA)    Difficulty of Paying Living Expenses: Not hard at all  Food Insecurity: No Food Insecurity (01/09/2021)   Hunger Vital Sign    Worried About Running Out of Food in the Last Year: Never true    Ran Out of Food in the Last Year: Never true  Transportation Needs: No Transportation Needs (01/09/2021)   PRAPARE - Administrator, Civil Service (Medical): No    Lack of Transportation (Non-Medical): No  Physical Activity: Insufficiently Active (01/09/2021)   Exercise Vital Sign    Days of Exercise per Week: 3 days    Minutes of Exercise per Session: 30 min  Stress: Not on file  Social Connections: Not on file   Family History  Problem Relation Age of Onset   Hypertension Mother    Hypertension Father    Heart attack Father    Stroke Maternal Grandmother    Allergies  Allergen Reactions   Hydrochlorothiazide     Other reaction(s): headaches   Current Outpatient Medications  Medication Sig Dispense Refill   acetaminophen (TYLENOL) 650 MG CR tablet  Take 650 mg by mouth 2 (two) times daily as needed for pain.     amLODipine  (NORVASC ) 10 MG tablet Take 10 mg by mouth daily.     atorvastatin (LIPITOR) 10 MG tablet Take 10 mg by mouth every evening.     cyclobenzaprine (FLEXERIL) 10 MG tablet Take 10 mg by mouth 3 (three) times daily as needed for muscle spasms.  0   diltiazem  (CARDIZEM ) 30 MG tablet Take 1 tablet every 4 hours AS NEEDED for heart rate >100 30 tablet 1   DiphenhydrAMINE HCl (BENADRYL PO) Take 25 mg by mouth 2 (two) times daily as needed (allergies).     doxazosin  (CARDURA ) 4 MG tablet TAKE 1 TABLET(4 MG) BY MOUTH DAILY 90 tablet 0   famotidine (PEPCID AC) 10 MG tablet Take 10 mg by mouth daily.     FARXIGA 10 MG TABS tablet Take 10 mg by  mouth every morning.     irbesartan (AVAPRO) 300 MG tablet Take 300 mg by mouth at bedtime.     meloxicam  (MOBIC ) 7.5 MG tablet TAKE 1 TABLET(7.5 MG) BY MOUTH DAILY 30 tablet 0   Ranitidine HCl (ZANTAC PO) Take 1 tablet by mouth daily. Zantac 150 MG Tablet 1 tablet every other day     Semaglutide-Weight Management (WEGOVY) 0.25 MG/0.5ML SOAJ 0.5 mL Subcutaneous     spironolactone (ALDACTONE) 25 MG tablet Take 25 mg by mouth daily.     UNABLE TO FIND CPAP     No current facility-administered medications for this visit.   No results found.  Review of Systems:   A ROS was performed including pertinent positives and negatives as documented in the HPI.  Physical Exam :   Constitutional: NAD and appears stated age Neurological: Alert and oriented Psych: Appropriate affect and cooperative There were no vitals taken for this visit.   Comprehensive Musculoskeletal Exam:    Right knee tricompartmental pain with crepitus range of motion is from -3 to 130 degrees.  Medial joint line tenderness.  No effusion good extension strength   Imaging:   Xray (4 views right knee): Moderate to severe tricompartmental osteoarthritis    I personally reviewed and interpreted the radiographs.   Assessment and Plan:   53 y.o. 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  At this time he has failed conservative management with the [facility] including 2 rounds of gel injections.  At this time he is seeking a total knee arthroplasty and given this and the fact that he has had treatment with Dr. Vernetta in the past for his left shoulder we will plan for referral to Dr. Vernetta for discussion of right total knee arthroplasty   I personally saw and evaluated the patient, and participated in the management and treatment plan.  Elspeth Parker, MD Attending Physician, Orthopedic Surgery  This document was dictated using Dragon voice recognition software. A reasonable attempt at proof  reading has been made to minimize errors.

## 2023-12-07 ENCOUNTER — Encounter: Payer: Self-pay | Admitting: Radiology

## 2023-12-28 ENCOUNTER — Ambulatory Visit (INDEPENDENT_AMBULATORY_CARE_PROVIDER_SITE_OTHER): Admitting: Orthopaedic Surgery

## 2023-12-28 VITALS — Ht 62.5 in | Wt 210.0 lb

## 2023-12-28 DIAGNOSIS — G8929 Other chronic pain: Secondary | ICD-10-CM | POA: Diagnosis not present

## 2023-12-28 DIAGNOSIS — M25561 Pain in right knee: Secondary | ICD-10-CM

## 2023-12-28 DIAGNOSIS — M1711 Unilateral primary osteoarthritis, right knee: Secondary | ICD-10-CM | POA: Insufficient documentation

## 2023-12-28 NOTE — Progress Notes (Signed)
 The patient is a 53 year old sent to me from my partner Dr. Genelle to evaluate and treat known osteoarthritis of the patient's right knee.  I actually saw him remotely in the past for his shoulder.  He is was a it sales professional.  His BMI is 37.80.  He has a remote history of a knee arthroscopy that right knee there is wearing knee sleeve on a daily basis.  He does take Mobic  and has worked on weight loss and he is on weight loss medications.  At this point his knee pain is daily with the right knee.  It is 10 out of 10 and it is detrimentally affecting his mobility, his quality of life and his actives daily living.  He does operate a standing forklift.  On exam his right knee has a mild effusion.  There is varus malalignment and significant medial joint line tenderness and patellofemoral capitation.  He lacks full extension by 30 degrees and we can flex him to about 120 degrees.  The knee feels ligamentously stable.  X-rays of the right knee reviewed today and show tricompartmental bone-on-bone arthritis with varus malalignment.  There are osteophytes in all 3 compartments and significant narrowing throughout the knee.  We had a long and thorough discussion about knee replacement surgery.  He will continue his weight loss journey and continue quad strengthening exercises and his knee sleeve.  I showed him a knee replacement model and discussed the risks and benefits of surgery and what to expect from an intraoperative and postoperative standpoint.  All questions and concerns were answered addressed.  He would like to have his knee replacement potentially in January but this depends on the schedule and his schedule.  We will be in touch with scheduling him for knee replacement surgery.

## 2024-02-09 NOTE — Progress Notes (Signed)
 Sent message, via epic in basket, requesting orders in epic from [facility].

## 2024-02-10 NOTE — Progress Notes (Addendum)
 Date of COVID positive in last 90 days:  PCP - [facility] Cardiologist - Annabella Scarce, MD  Chest x-ray - 05/06/23 Epic EKG - requested from [facility] Stress Test - 01/01/22 Epic ECHO - 11/21/20 Epic  Cardiac Cath - N/A Pacemaker/ICD device last checked:N/A Spinal Cord Stimulator:N/A  Bowel Prep - N/A  Sleep Study - yes CPAP - yes every night  Fasting Blood Sugar - preDM Checks Blood Sugar _____ times a day  Last dose of GLP1 agonist-  Semaglutide, takes Saturdays GLP1 instructions:  Do not take after  02/11/24   Last dose of SGLT-2 inhibitors-  Farxiga, taking for kidney function. Continue per protocol  SGLT-2 instructions:  Do not take after     Blood Thinner Instructions: N/A Last dose:   Time: Aspirin  Instructions:N/A Last Dose:  Activity level: Can go up a flight of stairs and perform activities of daily living without stopping and without symptoms of chest pain or shortness of breath.  Anesthesia review: HTN, a fib, murmur, palpitations, creatinine 2.26  Patient denies shortness of breath, fever, cough and chest pain at PAT appointment  Patient verbalized understanding of instructions that were given to them at the PAT appointment. Patient was also instructed that they will need to review over the PAT instructions again at home before surgery.

## 2024-02-10 NOTE — Patient Instructions (Addendum)
 SURGICAL WAITING ROOM VISITATION  Patients having surgery or a procedure may have no more than 2 support people in the waiting area - these visitors may rotate.    Children ages 11 and under will not be able to visit patients in [facility] under most circumstances.   Visitors with respiratory illnesses are discouraged from visiting and should remain at home.  If the patient needs to stay at the hospital during part of their recovery, the visitor guidelines for inpatient rooms apply. Pre-op nurse will coordinate an appropriate time for 1 support person to accompany patient in pre-op.  This support person may not rotate.    Please refer to the [facility] website for the visitor guidelines for Inpatients (after your surgery is over and you are in a regular room).    Your procedure is scheduled on: 02/19/24   Report to [facility] Main Entrance    Report to admitting at 11:30 AM   Call this number if you have problems the morning of surgery [phone]   Do not eat food :After Midnight.   After Midnight you may have the following liquids until 11:00 AM DAY OF SURGERY  Water  Non-Citrus Juices (without pulp, NO RED-Apple, White grape, White cranberry) Black Coffee (NO MILK/CREAM OR CREAMERS, sugar ok)  Clear Tea (NO MILK/CREAM OR CREAMERS, sugar ok) regular and decaf                             Plain Jell-O (NO RED)                                           Fruit ices (not with fruit pulp, NO RED)                                     Popsicles (NO RED)                                                               Sports drinks like Gatorade (NO RED)    The day of surgery:  Drink ONE (1) Pre-Surgery G2 at 11:00 AM the morning of surgery. Drink in one sitting. Do not sip.  This drink was given to you during your hospital  pre-op appointment visit. Nothing else to drink after completing the  Pre-Surgery G2.          If you have questions, please contact your  surgeons office.   FOLLOW BOWEL PREP AND ANY ADDITIONAL PRE OP INSTRUCTIONS YOU RECEIVED FROM YOUR SURGEON'S OFFICE!!!     Oral Hygiene is also important to reduce your risk of infection.                                    Remember - BRUSH YOUR TEETH THE MORNING OF SURGERY WITH YOUR REGULAR TOOTHPASTE  DENTURES WILL BE REMOVED PRIOR TO SURGERY PLEASE DO NOT APPLY Poly grip OR ADHESIVES!!!   Stop all vitamins and herbal supplements 7 days before  surgery.   Do not take GLP-1 [facility]) after 02/11/24.   Take these medicines the morning of surgery with A SIP OF WATER : Tylenol , Amlodipine   Bring CPAP mask and tubing day of surgery.                              You may not have any metal on your body including jewelry, and body piercing             Do not wear lotions, powders, cologne, or deodorant              Men may shave face and neck.   Do not bring valuables to the hospital. [place] IS NOT             RESPONSIBLE   FOR VALUABLES.   Contacts, glasses, dentures or bridgework may not be worn into surgery.   Bring small overnight bag day of surgery.   DO NOT BRING YOUR HOME MEDICATIONS TO THE HOSPITAL. PHARMACY WILL DISPENSE MEDICATIONS LISTED ON YOUR MEDICATION LIST TO YOU DURING YOUR ADMISSION IN THE HOSPITAL!               Please read over the following fact sheets you were given: IF YOU HAVE QUESTIONS ABOUT YOUR PRE-OP INSTRUCTIONS PLEASE CALL [phone] [address] [place].   If you received a COVID test during your pre-op visit  it is requested that you wear a mask when out in public, stay away from anyone that may not be feeling well and notify your surgeon if you develop symptoms. If you test positive for Covid or have been in contact with anyone that has tested positive in the last 10 days please notify you surgeon.      Pre-operative 4 CHG Bath Instructions  DYNA-Hex 4 Chlorhexidine  Gluconate 4% Solution Antiseptic 4 fl. oz   You can play a key role in reducing the risk  of infection after surgery. Your skin needs to be as free of germs as possible. You can reduce the number of germs on your skin by washing with CHG (chlorhexidine  gluconate) soap before surgery. CHG is an antiseptic soap that kills germs and continues to kill germs even after washing.   DO NOT use if you have an allergy to chlorhexidine /CHG or antibacterial soaps. If your skin becomes reddened or irritated, stop using the CHG and notify one of our RNs at   Please shower with the CHG soap starting 4 days before surgery using the following schedule:     Please keep in mind the following:  DO NOT shave, including legs and underarms, starting the day of your first shower.   You may shave your face at any point before/day of surgery.  Place clean sheets on your bed the day you start using CHG soap. Use a clean washcloth (not used since being washed) for each shower. DO NOT sleep with pets once you start using the CHG.  CHG Shower Instructions:  If you choose to wash your hair and private area, wash first with your normal shampoo/soap.  After you use shampoo/soap, rinse your hair and body thoroughly to remove shampoo/soap residue.  Turn the water  OFF and apply about 3 tablespoons (45 ml) of CHG soap to a CLEAN washcloth.  Apply CHG soap ONLY FROM YOUR NECK DOWN TO YOUR TOES (washing for 3-5 minutes)  DO NOT use CHG soap on face, private areas, open wounds, or sores.  Pay special attention  to the area where your surgery is being performed.  If you are having back surgery, having someone wash your back for you may be helpful. Wait 2 minutes after CHG soap is applied, then you may rinse off the CHG soap.  Pat dry with a clean towel  Put on clean clothes/pajamas   If you choose to wear lotion, please use ONLY the CHG-compatible lotions on the back of this paper.     Additional instructions for the day of surgery: DO NOT APPLY any lotions, deodorants, cologne, or perfumes.   Put on  clean/comfortable clothes.  Brush your teeth.  Ask your nurse before applying any prescription medications to the skin.   CHG Compatible Lotions   Aveeno Moisturizing lotion  Cetaphil Moisturizing Cream  Cetaphil Moisturizing Lotion  Clairol Herbal Essence Moisturizing Lotion, Dry Skin  Clairol Herbal Essence Moisturizing Lotion, Extra Dry Skin  Clairol Herbal Essence Moisturizing Lotion, Normal Skin  Curel Age Defying Therapeutic Moisturizing Lotion with Alpha Hydroxy  Curel Extreme Care Body Lotion  Curel Soothing Hands Moisturizing Hand Lotion  Curel Therapeutic Moisturizing Cream, Fragrance-Free  Curel Therapeutic Moisturizing Lotion, Fragrance-Free  Curel Therapeutic Moisturizing Lotion, Original Formula  Eucerin Daily Replenishing Lotion  Eucerin Dry Skin Therapy Plus Alpha Hydroxy Crme  Eucerin Dry Skin Therapy Plus Alpha Hydroxy Lotion  Eucerin Original Crme  Eucerin Original Lotion  Eucerin Plus Crme Eucerin Plus Lotion  Eucerin TriLipid Replenishing Lotion  Keri Anti-Bacterial Hand Lotion  Keri Deep Conditioning Original Lotion Dry Skin Formula Softly Scented  Keri Deep Conditioning Original Lotion, Fragrance Free Sensitive Skin Formula  Keri Lotion Fast Absorbing Fragrance Free Sensitive Skin Formula  Keri Lotion Fast Absorbing Softly Scented Dry Skin Formula  Keri Original Lotion  Keri Skin Renewal Lotion Keri Silky Smooth Lotion  Keri Silky Smooth Sensitive Skin Lotion  Nivea Body Creamy Conditioning Oil  Nivea Body Extra Enriched Lotion  Nivea Body Original Lotion  Nivea Body Sheer Moisturizing Lotion Nivea Crme  Nivea Skin Firming Lotion  NutraDerm 30 Skin Lotion  NutraDerm Skin Lotion  NutraDerm Therapeutic Skin Cream  NutraDerm Therapeutic Skin Lotion  ProShield Protective Hand Cream  Provon moisturizing lotion  View Pre-Surgery Education Videos:  [email or web address]     [place] -  Preparing for Surgery Before surgery, you can play an important role.  Because skin is not sterile, your skin needs to be as free of germs as possible.  You can reduce the number of germs on your skin by washing with CHG (chlorahexidine gluconate) soap before surgery.  CHG is an antiseptic cleaner which kills germs and bonds with the skin to continue killing germs even after washing. Please DO NOT use if you have an allergy to CHG or antibacterial soaps.  If your skin becomes reddened/irritated stop using the CHG and inform your nurse when you arrive at Short Stay. Do not shave (including legs and underarms) for at least 48 hours prior to the first CHG shower.  You may shave your face/neck.  Please follow these instructions carefully:  1.  Shower with CHG Soap the night before surgery ONLY (DO NOT USE THE SOAP THE MORNING OF SURGERY).  2.  If you choose to wash your hair, wash your hair first as usual with your normal  shampoo.  3.  After you shampoo, rinse your hair and body thoroughly to remove the shampoo.  4.  Use CHG as you would any other liquid soap.  You can apply chg directly to the skin and wash.  Gently with a scrungie or clean washcloth.  5.  Apply the CHG Soap to your body ONLY FROM THE NECK DOWN.   Do   not use on face/ open                           Wound or open sores. Avoid contact with eyes, ears mouth and   genitals (private parts).                       Wash face,  Genitals (private parts) with your normal soap.             6.  Wash thoroughly, paying special attention to the area where your    surgery  will be performed.  7.  Thoroughly rinse your body with warm water  from the neck down.  8.  DO NOT shower/wash with your normal soap after using and rinsing off the CHG Soap.                9.  Pat yourself dry with a clean towel.            10.  Wear clean pajamas.            11.  Place clean sheets on your bed the night of your first shower and do not   sleep with pets. Day of Surgery : Do not apply any CHG, lotions/deodorants the morning of surgery.  Please wear clean clothes to the hospital/surgery center.  FAILURE TO FOLLOW THESE INSTRUCTIONS MAY RESULT IN THE CANCELLATION OF YOUR SURGERY  PATIENT SIGNATURE_________________________________  NURSE SIGNATURE__________________________________  ________________________________________________________________________

## 2024-02-11 ENCOUNTER — Encounter (HOSPITAL_COMMUNITY): Payer: Self-pay

## 2024-02-11 ENCOUNTER — Other Ambulatory Visit: Payer: Self-pay

## 2024-02-11 ENCOUNTER — Encounter (HOSPITAL_COMMUNITY)
Admission: RE | Admit: 2024-02-11 | Discharge: 2024-02-11 | Disposition: A | Discharge status: Home / Self Care | Source: Ambulatory Visit | Attending: Orthopaedic Surgery | Admitting: Orthopaedic Surgery

## 2024-02-11 VITALS — BP 119/73 | HR 46 | Temp 97.8°F | Resp 16 | Ht 62.5 in | Wt 209.0 lb

## 2024-02-11 DIAGNOSIS — I10 Essential (primary) hypertension: Secondary | ICD-10-CM | POA: Diagnosis not present

## 2024-02-11 DIAGNOSIS — M1711 Unilateral primary osteoarthritis, right knee: Secondary | ICD-10-CM | POA: Diagnosis not present

## 2024-02-11 DIAGNOSIS — Z01812 Encounter for preprocedural laboratory examination: Secondary | ICD-10-CM | POA: Insufficient documentation

## 2024-02-11 DIAGNOSIS — Z01818 Encounter for other preprocedural examination: Secondary | ICD-10-CM

## 2024-02-11 HISTORY — DX: Prediabetes: R73.03

## 2024-02-11 HISTORY — DX: Chronic kidney disease, unspecified: N18.9

## 2024-02-11 HISTORY — DX: Unspecified atrial fibrillation: I48.91

## 2024-02-11 HISTORY — DX: Anxiety disorder, unspecified: F41.9

## 2024-02-11 HISTORY — DX: Gastro-esophageal reflux disease without esophagitis: K21.9

## 2024-02-11 HISTORY — DX: Dizziness and giddiness: R42

## 2024-02-11 HISTORY — DX: Unspecified osteoarthritis, unspecified site: M19.90

## 2024-02-11 LAB — BASIC METABOLIC PANEL WITH GFR
Anion gap: 8 (ref 5–15)
BUN: 26 mg/dL — ABNORMAL HIGH (ref 6–20)
CO2: 25 mmol/L (ref 22–32)
Calcium: 9.5 mg/dL (ref 8.9–10.3)
Chloride: 104 mmol/L (ref 98–111)
Creatinine, Ser: 2.36 mg/dL — ABNORMAL HIGH (ref 0.61–1.24)
GFR, Estimated: 32 mL/min — ABNORMAL LOW
Glucose, Bld: 78 mg/dL (ref 70–99)
Potassium: 4.8 mmol/L (ref 3.5–5.1)
Sodium: 137 mmol/L (ref 135–145)

## 2024-02-11 LAB — CBC
HCT: 46.7 % (ref 39.0–52.0)
Hemoglobin: 14.6 g/dL (ref 13.0–17.0)
MCH: 24.4 pg — ABNORMAL LOW (ref 26.0–34.0)
MCHC: 31.3 g/dL (ref 30.0–36.0)
MCV: 78 fL — ABNORMAL LOW (ref 80.0–100.0)
Platelets: 232 K/uL (ref 150–400)
RBC: 5.99 MIL/uL — ABNORMAL HIGH (ref 4.22–5.81)
RDW: 15.4 % (ref 11.5–15.5)
WBC: 4 K/uL (ref 4.0–10.5)
nRBC: 0 % (ref 0.0–0.2)

## 2024-02-11 LAB — SURGICAL PCR SCREEN
MRSA, PCR: NEGATIVE
Staphylococcus aureus: POSITIVE — AB

## 2024-02-11 NOTE — Progress Notes (Signed)
[facility] + results routed to Dr. Blackman 

## 2024-02-17 ENCOUNTER — Encounter (HOSPITAL_COMMUNITY): Payer: Self-pay

## 2024-02-17 NOTE — Progress Notes (Addendum)
 " Case: [id number] Date/Time: 02/19/24 1345   Procedure: ARTHROPLASTY, KNEE, TOTAL (Right: Knee)   Anesthesia type: Spinal   Diagnosis: Primary osteoarthritis of right knee [M17.11]   Pre-op diagnosis: Osteoarthritis Right Knee   Location: [facility] ROOM 10 / [facility]   Surgeons: Vernetta Lonni GRADE, MD       DISCUSSION: Robert Haney is a 54 yo male with PMH of HTN, A-fib not anticoagulated, OSA on CPAP, GERD, CKD3-4, anxiety, arthritis, obesity.  Patient follows with cardiology for history of A-fib not on anticoagulation due to CHA2DS2-VASc score of 1.  Echo in 2022 showed normal LVEF with no significant valve disease.  He was last seen in clinic on 04/28/2022 by NP Walker.  Patient reportedly doing well without any symptoms.  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  Patient advised to follow-up in 1 year but appears to have missed his follow-up.  Patient gets his medical care at the [facility].  He is undergoing physical therapy for vestibular rehab and neck pain. Last seen by PCP on 07/14/23. BP controlled. He has rare palpitations and takes Diltiazem  prn (has only taken 2x in the past year per notes).  Follows with Nephrology at the [facility] for CKD3-4. Last seen on 11/18. Baseline SCr is ~2.2 with GFR 31. Pre op kidney function is consistent with this.  LD Wegovy: 1/3  VS: BP 119/73   Pulse (!) 46   Temp 36.6 C (Oral)   Resp 16   Ht 5' 2.5 (1.588 m)   Wt 94.8 kg   SpO2 98%   BMI 37.62 kg/m   PROVIDERS: Clinic, [facility]   LABS: Labs reviewed: Acceptable for surgery. (all labs ordered are listed, but only abnormal results are displayed)  Labs Reviewed  SURGICAL PCR SCREEN - Abnormal; Notable for the following components:      Result Value   Staphylococcus aureus POSITIVE (*)    All other components within normal limits  CBC - Abnormal; Notable for the following components:   RBC 5.99 (*)    MCV 78.0 (*)    MCH 24.4 (*)    All other components within normal limits  BASIC METABOLIC  PANEL WITH GFR - Abnormal; Notable for the following components:   BUN 26 (*)    Creatinine, Ser 2.36 (*)    GFR, Estimated 32 (*)    All other components within normal limits      EKG: Requested from [facility]. Obtain DOS if note sent   ETT 01/01/2022:    No ST deviation was noted.   ETT with good exercise tolerance (8:00); no CP; normal BP response; no ST changes; negative adequate ETT.  Echo 11/21/2020:  IMPRESSIONS    1. Left ventricular ejection fraction, by estimation, is 55 to 60%. Left ventricular ejection fraction by 3D volume is 57 %. The left ventricle has normal function. The left ventricle has no regional wall motion abnormalities. Left ventricular diastolic  parameters were normal. The average left ventricular global longitudinal strain is -21.8 %. The global longitudinal strain is normal.  2. Right ventricular systolic function is normal. The right ventricular size is normal. Tricuspid regurgitation signal is inadequate for assessing PA pressure.  3. The mitral valve is normal in structure. Trivial mitral valve regurgitation. No evidence of mitral stenosis.  4. The aortic valve is normal in structure. Aortic valve regurgitation is not visualized. No aortic stenosis is present.  5. The inferior vena cava is normal in size with greater than 50% respiratory variability, suggesting right  atrial pressure of 3 mmHg.  Past Medical History:  Diagnosis Date   Anxiety    Arthritis    Atrial fibrillation with RVR (HCC)    Chronic kidney disease    Elevated BP    elevated BP readings-2010-------- normalized when rechecked 12/01/08   Elevated LDL cholesterol level     (normal TSH 2008)   Essential hypertension 09/27/2015   GERD (gastroesophageal reflux disease)    Hyperlipidemia 09/27/2015   Hypertension, essential    Leukocytopenia    Mild leukocytopenia- work up including Bone Marrow bx done, normal variant- Dr. gatha Mar[facility] 8/11   Low  testosterone    (symptomatic)--2.60 03/2008   Morbid obesity (HCC) 09/27/2015   Murmur 09/27/2015   Overweight    Palpitations 09/27/2015   Pre-diabetes    Vertigo     Past Surgical History:  Procedure Laterality Date   COLONOSCOPY     KNEE SURGERY  02/04/1992   right knee    WRIST SURGERY  02/04/1991   left wrist     MEDICATIONS:  acetaminophen  (TYLENOL ) 650 MG CR tablet   albuterol  (VENTOLIN  HFA) 108 (90 Base) MCG/ACT inhaler   amLODipine  (NORVASC ) 5 MG tablet   atorvastatin (LIPITOR) 10 MG tablet   cyclobenzaprine  (FLEXERIL ) 10 MG tablet   diltiazem  (CARDIZEM ) 30 MG tablet   DiphenhydrAMINE  HCl (BENADRYL  PO)   doxazosin  (CARDURA ) 4 MG tablet   empagliflozin  (JARDIANCE ) 25 MG TABS tablet   famotidine  (PEPCID  AC) 10 MG tablet   FARXIGA 10 MG TABS tablet   irbesartan  (AVAPRO ) 300 MG tablet   meloxicam  (MOBIC ) 7.5 MG tablet   Ranitidine HCl (ZANTAC PO)   Semaglutide-Weight Management (WEGOVY) 0.25 MG/0.5ML SOAJ   spironolactone  (ALDACTONE ) 25 MG tablet   UNABLE TO FIND   No current facility-administered medications for this encounter.   Burnard CHRISTELLA Odis DEVONNA [facility] Surgical Short Stay/Anesthesiology [facility] Phone [phone] 02/17/2024 9:25 AM        "

## 2024-02-17 NOTE — Anesthesia Preprocedure Evaluation (Addendum)
"                                    Anesthesia Evaluation  Patient identified by MRN, date of birth, ID band Patient awake    Reviewed: Allergy & Precautions, NPO status , Patient's Chart, lab work & pertinent test results  Airway Mallampati: II  TM Distance: >3 FB Neck ROM: Full    Dental  (+) Dental Advisory Given   Pulmonary neg pulmonary ROS   breath sounds clear to auscultation       Cardiovascular hypertension, Pt. on medications + dysrhythmias Atrial Fibrillation  Rhythm:Regular Rate:Normal     Neuro/Psych negative neurological ROS     GI/Hepatic Neg liver ROS,GERD  ,,  Endo/Other  negative endocrine ROS    Renal/GU Renal Insufficiency and CRFRenal disease     Musculoskeletal  (+) Arthritis ,    Abdominal   Peds  Hematology negative hematology ROS (+)   Anesthesia Other Findings   Reproductive/Obstetrics                              Anesthesia Physical Anesthesia Plan  ASA: 2  Anesthesia Plan: Spinal   Post-op Pain Management: Tylenol  PO (pre-op)* and Regional block*   Induction:   PONV Risk Score and Plan: 1 and Propofol  infusion, Dexamethasone  and Ondansetron   Airway Management Planned: Natural Airway and Simple Face Mask  Additional Equipment: None  Intra-op Plan:   Post-operative Plan:   Informed Consent: I have reviewed the patients History and Physical, chart, labs and discussed the procedure including the risks, benefits and alternatives for the proposed anesthesia with the patient or authorized representative who has indicated his/her understanding and acceptance.       Plan Discussed with:   Anesthesia Plan Comments:          Anesthesia Quick Evaluation  "

## 2024-02-18 NOTE — H&P (Signed)
 [name]  Patient is being admitted for right total knee arthroplasty.  Subjective:  Chief Complaint:right knee pain.  HPI: Robert Haney, 54 y.o. male, has a history of pain and functional disability in the right knee due to arthritis and has failed non-surgical conservative treatments for greater than 12 weeks to includeNSAID's and/or analgesics, corticosteriod injections, weight reduction as appropriate, and activity modification.  Onset of symptoms was gradual, starting several years ago with gradually worsening course since that time. The patient noted prior procedures on the knee to include  arthroscopy on the right knee(s).  Patient currently rates pain in the right knee(s) at 10 out of 10 with activity. Patient has night pain, worsening of pain with activity and weight bearing, pain that interferes with activities of daily living, pain with passive range of motion, crepitus, and joint swelling.  Patient has evidence of subchondral sclerosis, periarticular osteophytes, and joint space narrowing by imaging studies. There is no active infection.  Patient Active Problem List   Diagnosis Date Noted   Unilateral primary osteoarthritis, right knee 12/28/2023   Kidney disease 09/23/2022   Paroxysmal atrial fibrillation (HCC) 10/22/2020   Palpitations 09/27/2015   Essential hypertension 09/27/2015   Murmur 09/27/2015   Hyperlipidemia 09/27/2015   Morbid obesity (HCC) 09/27/2015   Past Medical History:  Diagnosis Date   Anxiety    Arthritis    Atrial fibrillation with RVR (HCC)    Chronic kidney disease    Elevated BP    elevated BP readings-2010-------- normalized when rechecked 12/01/08   Elevated LDL cholesterol level     (normal TSH 2008)   Essential hypertension 09/27/2015   GERD (gastroesophageal reflux disease)    Hyperlipidemia 09/27/2015   Hypertension, essential    Leukocytopenia    Mild leukocytopenia- work up including Bone Marrow bx done, normal  variant- Dr. gatha [facility] 8/11   Low testosterone    (symptomatic)--2.60 03/2008   Morbid obesity (HCC) 09/27/2015   Murmur 09/27/2015   Overweight    Palpitations 09/27/2015   Pre-diabetes    Vertigo     Past Surgical History:  Procedure Laterality Date   COLONOSCOPY     KNEE SURGERY  02/04/1992   right knee    WRIST SURGERY  02/04/1991   left wrist     No current facility-administered medications for this encounter.   Current Outpatient Medications  Medication Sig Dispense Refill Last Dose/Taking   acetaminophen  (TYLENOL ) 650 MG CR tablet Take 650 mg by mouth 2 (two) times daily as needed for pain.   Taking As Needed   albuterol  (VENTOLIN  HFA) 108 (90 Base) MCG/ACT inhaler Inhale 2 puffs into the lungs every 4 (four) hours as needed for shortness of breath or wheezing.   Taking As Needed   amLODipine  (NORVASC ) 5 MG tablet Take 5 mg by mouth daily.   Taking   atorvastatin (LIPITOR) 10 MG tablet Take 10 mg by mouth every evening.   Taking   cyclobenzaprine  (FLEXERIL ) 10 MG tablet Take 10 mg by mouth 3 (three) times daily as needed for muscle spasms.  0 Taking As Needed   diltiazem  (CARDIZEM ) 30 MG tablet Take 1 tablet every 4 hours AS NEEDED for heart rate >100 (Patient taking differently: Take 30 mg by mouth every 4 (four) hours as needed (Heart rate >100). Take 1 tablet every 4 hours AS NEEDED for heart rate >100) 30 tablet 1 Taking Differently   DiphenhydrAMINE  HCl (BENADRYL  PO) Take 25 mg by mouth 2 (two)  times daily as needed (allergies).   Taking As Needed   doxazosin  (CARDURA ) 4 MG tablet TAKE 1 TABLET(4 MG) BY MOUTH DAILY (Patient taking differently: Take 4 mg by mouth daily.) 90 tablet 0 Taking Differently   empagliflozin  (JARDIANCE ) 25 MG TABS tablet Take 12.5 mg by mouth daily.   Taking   famotidine  (PEPCID  AC) 10 MG tablet Take 10 mg by mouth daily.   Taking   irbesartan  (AVAPRO ) 300 MG tablet Take 300 mg by mouth at bedtime.   Taking   meloxicam  (MOBIC )  7.5 MG tablet TAKE 1 TABLET(7.5 MG) BY MOUTH DAILY (Patient taking differently: Take 7.5 mg by mouth daily.) 30 tablet 0 Taking Differently   Ranitidine HCl (ZANTAC PO) Take 1 tablet by mouth daily. Zantac 150 MG Tablet 1 tablet every other day   Taking   Semaglutide-Weight Management (WEGOVY) 0.25 MG/0.5ML SOAJ Inject 0.25 mg into the skin once a week.   Taking   spironolactone  (ALDACTONE ) 25 MG tablet Take 25 mg by mouth daily.   Taking   FARXIGA 10 MG TABS tablet Take 10 mg by mouth every morning. (Patient not taking: Reported on 02/11/2024)   Not Taking   UNABLE TO FIND CPAP      Allergies[1]  Social History   Tobacco Use   Smoking status: Never   Smokeless tobacco: Never  Substance Use Topics   Alcohol use: Yes    Alcohol/week: 2.0 standard drinks of alcohol    Types: 1 Cans of beer, 1 Standard drinks or equivalent per week    Family History  Problem Relation Age of Onset   Hypertension Mother    Hypertension Father    Heart attack Father    Stroke Maternal Grandmother      Review of Systems  Objective:  Physical Exam Vitals reviewed.  Constitutional:      Appearance: Normal appearance. He is obese.  HENT:     Head: Normocephalic and atraumatic.  Eyes:     Extraocular Movements: Extraocular movements intact.     Pupils: Pupils are equal, round, and reactive to light.  Cardiovascular:     Rate and Rhythm: Normal rate and regular rhythm.  Pulmonary:     Effort: Pulmonary effort is normal.     Breath sounds: Normal breath sounds.  Abdominal:     Palpations: Abdomen is soft.  Musculoskeletal:     Cervical back: Normal range of motion and neck supple.     Right knee: Effusion, bony tenderness and crepitus present. Decreased range of motion. Tenderness present over the medial joint line and lateral joint line. Abnormal alignment and abnormal meniscus.  Neurological:     Mental Status: He is alert and oriented to person, place, and time.  Psychiatric:        Behavior:  Behavior normal.     Vital signs in last 24 hours:    Labs:   Estimated body mass index is 37.62 kg/m as calculated from the following:   Height as of 02/11/24: 5' 2.5 (1.588 m).   Weight as of 02/11/24: 94.8 kg.   Imaging Review Plain radiographs demonstrate severe degenerative joint disease of the right knee(s). The overall alignment ismild varus. The bone quality appears to be excellent for age and reported activity level.      Assessment/Plan:  End stage arthritis, right knee   The patient history, physical examination, clinical judgment of the provider and imaging studies are consistent with end stage degenerative joint disease of the right knee(s) and total  knee arthroplasty is deemed medically necessary. The treatment options including medical management, injection therapy arthroscopy and arthroplasty were discussed at length. The risks and benefits of total knee arthroplasty were presented and reviewed. The risks due to aseptic loosening, infection, stiffness, patella tracking problems, thromboembolic complications and other imponderables were discussed. The patient acknowledged the explanation, agreed to proceed with the plan and consent was signed. Patient is being admitted for inpatient treatment for surgery, pain control, PT, OT, prophylactic antibiotics, VTE prophylaxis, progressive ambulation and ADL's and discharge planning. The patient is planning to be discharged home with home health services        [1]  Allergies Allergen Reactions   Hydrochlorothiazide     Other reaction(s): headaches

## 2024-02-19 ENCOUNTER — Ambulatory Visit (HOSPITAL_COMMUNITY): Admitting: Anesthesiology

## 2024-02-19 ENCOUNTER — Observation Stay (HOSPITAL_COMMUNITY)
Admission: RE | Admit: 2024-02-19 | Discharge: 2024-02-21 | Disposition: A | Discharge status: Home / Self Care | Source: Ambulatory Visit | Attending: Orthopaedic Surgery | Admitting: Orthopaedic Surgery

## 2024-02-19 ENCOUNTER — Encounter (HOSPITAL_COMMUNITY)
Admission: RE | Disposition: A | Discharge status: Home / Self Care | Payer: Self-pay | Source: Ambulatory Visit | Attending: Orthopaedic Surgery

## 2024-02-19 ENCOUNTER — Observation Stay (HOSPITAL_COMMUNITY)

## 2024-02-19 ENCOUNTER — Encounter (HOSPITAL_COMMUNITY): Payer: Self-pay | Admitting: Orthopaedic Surgery

## 2024-02-19 ENCOUNTER — Encounter (HOSPITAL_COMMUNITY): Admitting: Medical

## 2024-02-19 ENCOUNTER — Other Ambulatory Visit: Payer: Self-pay

## 2024-02-19 DIAGNOSIS — I10 Essential (primary) hypertension: Secondary | ICD-10-CM

## 2024-02-19 DIAGNOSIS — I129 Hypertensive chronic kidney disease with stage 1 through stage 4 chronic kidney disease, or unspecified chronic kidney disease: Secondary | ICD-10-CM | POA: Diagnosis not present

## 2024-02-19 DIAGNOSIS — M1711 Unilateral primary osteoarthritis, right knee: Secondary | ICD-10-CM

## 2024-02-19 DIAGNOSIS — Z96651 Presence of right artificial knee joint: Secondary | ICD-10-CM

## 2024-02-19 DIAGNOSIS — N189 Chronic kidney disease, unspecified: Secondary | ICD-10-CM | POA: Diagnosis not present

## 2024-02-19 DIAGNOSIS — Z79899 Other long term (current) drug therapy: Secondary | ICD-10-CM | POA: Insufficient documentation

## 2024-02-19 DIAGNOSIS — M25561 Pain in right knee: Secondary | ICD-10-CM | POA: Diagnosis present

## 2024-02-19 LAB — BASIC METABOLIC PANEL WITH GFR
Anion gap: 10 (ref 5–15)
BUN: 16 mg/dL (ref 6–20)
CO2: 26 mmol/L (ref 22–32)
Calcium: 9.2 mg/dL (ref 8.9–10.3)
Chloride: 103 mmol/L (ref 98–111)
Creatinine, Ser: 1.96 mg/dL — ABNORMAL HIGH (ref 0.61–1.24)
GFR, Estimated: 40 mL/min — ABNORMAL LOW
Glucose, Bld: 83 mg/dL (ref 70–99)
Potassium: 4.4 mmol/L (ref 3.5–5.1)
Sodium: 138 mmol/L (ref 135–145)

## 2024-02-19 LAB — CBC
HCT: 48.2 % (ref 39.0–52.0)
Hemoglobin: 15.1 g/dL (ref 13.0–17.0)
MCH: 24.3 pg — ABNORMAL LOW (ref 26.0–34.0)
MCHC: 31.3 g/dL (ref 30.0–36.0)
MCV: 77.6 fL — ABNORMAL LOW (ref 80.0–100.0)
Platelets: 236 K/uL (ref 150–400)
RBC: 6.21 MIL/uL — ABNORMAL HIGH (ref 4.22–5.81)
RDW: 15.6 % — ABNORMAL HIGH (ref 11.5–15.5)
WBC: 3.4 K/uL — ABNORMAL LOW (ref 4.0–10.5)
nRBC: 0 % (ref 0.0–0.2)

## 2024-02-19 MED ORDER — TRANEXAMIC ACID-NACL 1000-0.7 MG/100ML-% IV SOLN
1000.0000 mg | INTRAVENOUS | Status: AC
  Administered 2024-02-19: 1000 mg via INTRAVENOUS
  Filled 2024-02-19: qty 100

## 2024-02-19 MED ORDER — LACTATED RINGERS IV SOLN: INTRAVENOUS | Status: DC

## 2024-02-19 MED ORDER — SODIUM CHLORIDE 0.9 % IR SOLN
Status: DC | PRN
  Administered 2024-02-19: 1000 mL

## 2024-02-19 MED ORDER — LIDOCAINE HCL (CARDIAC) PF 100 MG/5ML IV SOSY
PREFILLED_SYRINGE | INTRAVENOUS | Status: DC | PRN
  Administered 2024-02-19: 40 mg via INTRAVENOUS

## 2024-02-19 MED ORDER — DROPERIDOL 2.5 MG/ML IJ SOLN: 0.6250 mg | Freq: Once | INTRAMUSCULAR | Status: DC | PRN

## 2024-02-19 MED ORDER — ORAL CARE MOUTH RINSE: 15.0000 mL | Freq: Once | OROMUCOSAL | Status: AC

## 2024-02-19 MED ORDER — 0.9 % SODIUM CHLORIDE (POUR BTL) OPTIME
TOPICAL | Status: DC | PRN
  Administered 2024-02-19: 1000 mL

## 2024-02-19 MED ORDER — SODIUM CHLORIDE 0.9 % IV SOLN: INTRAVENOUS | Status: AC

## 2024-02-19 MED ORDER — LIDOCAINE HCL (PF) 2 % IJ SOLN
INTRAMUSCULAR | Status: AC
  Filled 2024-02-19: qty 5

## 2024-02-19 MED ORDER — MENTHOL 3 MG MT LOZG: 1.0000 | LOZENGE | OROMUCOSAL | Status: DC | PRN

## 2024-02-19 MED ORDER — DOCUSATE SODIUM 100 MG PO CAPS
100.0000 mg | ORAL_CAPSULE | Freq: Two times a day (BID) | ORAL | Status: DC
  Administered 2024-02-19 – 2024-02-21 (×4): 100 mg via ORAL
  Filled 2024-02-19 (×5): qty 1

## 2024-02-19 MED ORDER — CEFAZOLIN SODIUM-DEXTROSE 2-4 GM/100ML-% IV SOLN
2.0000 g | Freq: Four times a day (QID) | INTRAVENOUS | Status: AC
  Administered 2024-02-19 – 2024-02-20 (×2): 2 g via INTRAVENOUS
  Filled 2024-02-19 (×2): qty 100

## 2024-02-19 MED ORDER — MIDAZOLAM HCL (PF) 2 MG/2ML IJ SOLN
1.0000 mg | Freq: Once | INTRAMUSCULAR | Status: AC
  Administered 2024-02-19: 1 mg via INTRAVENOUS
  Filled 2024-02-19: qty 2

## 2024-02-19 MED ORDER — BUPIVACAINE-EPINEPHRINE (PF) 0.25% -1:200000 IJ SOLN
INTRAMUSCULAR | Status: AC
  Filled 2024-02-19: qty 30

## 2024-02-19 MED ORDER — BUPIVACAINE IN DEXTROSE 0.75-8.25 % IT SOLN
INTRATHECAL | Status: DC | PRN
  Administered 2024-02-19: 1.6 mL via INTRATHECAL

## 2024-02-19 MED ORDER — OXYCODONE HCL 5 MG/5ML PO SOLN: 5.0000 mg | Freq: Once | ORAL | Status: AC | PRN

## 2024-02-19 MED ORDER — METOCLOPRAMIDE HCL 5 MG/ML IJ SOLN
5.0000 mg | Freq: Three times a day (TID) | INTRAMUSCULAR | Status: DC | PRN
  Administered 2024-02-20: 5 mg via INTRAVENOUS
  Filled 2024-02-19: qty 2

## 2024-02-19 MED ORDER — PANTOPRAZOLE SODIUM 40 MG PO TBEC
40.0000 mg | DELAYED_RELEASE_TABLET | Freq: Every day | ORAL | Status: DC
  Administered 2024-02-19 – 2024-02-21 (×2): 40 mg via ORAL
  Filled 2024-02-19 (×3): qty 1

## 2024-02-19 MED ORDER — OXYCODONE HCL 5 MG PO TABS
5.0000 mg | ORAL_TABLET | ORAL | Status: DC | PRN
  Administered 2024-02-20 – 2024-02-21 (×2): 10 mg via ORAL
  Filled 2024-02-19 (×3): qty 2

## 2024-02-19 MED ORDER — ROPIVACAINE HCL 5 MG/ML IJ SOLN
INTRAMUSCULAR | Status: DC | PRN
  Administered 2024-02-19: 20 mL via PERINEURAL

## 2024-02-19 MED ORDER — STERILE WATER FOR IRRIGATION IR SOLN
Status: DC | PRN
  Administered 2024-02-19: 2000 mL

## 2024-02-19 MED ORDER — CEFAZOLIN SODIUM-DEXTROSE 2-4 GM/100ML-% IV SOLN
2.0000 g | INTRAVENOUS | Status: AC
  Administered 2024-02-19: 2 g via INTRAVENOUS
  Filled 2024-02-19: qty 100

## 2024-02-19 MED ORDER — PHENOL 1.4 % MT LIQD: 1.0000 | OROMUCOSAL | Status: DC | PRN

## 2024-02-19 MED ORDER — CHLORHEXIDINE GLUCONATE 0.12 % MT SOLN
15.0000 mL | Freq: Once | OROMUCOSAL | Status: AC
  Administered 2024-02-19: 15 mL via OROMUCOSAL

## 2024-02-19 MED ORDER — ONDANSETRON HCL 4 MG PO TABS: 4.0000 mg | ORAL_TABLET | Freq: Four times a day (QID) | ORAL | Status: DC | PRN

## 2024-02-19 MED ORDER — PROPOFOL 10 MG/ML IV BOLUS
INTRAVENOUS | Status: AC
  Filled 2024-02-19: qty 20

## 2024-02-19 MED ORDER — EMPAGLIFLOZIN 25 MG PO TABS
25.0000 mg | ORAL_TABLET | Freq: Every day | ORAL | Status: DC
  Administered 2024-02-20 – 2024-02-21 (×2): 25 mg via ORAL
  Filled 2024-02-19 (×2): qty 1

## 2024-02-19 MED ORDER — HYDROMORPHONE HCL 1 MG/ML IJ SOLN
0.5000 mg | INTRAMUSCULAR | Status: DC | PRN
  Administered 2024-02-19 – 2024-02-20 (×2): 1 mg via INTRAVENOUS
  Filled 2024-02-19 (×3): qty 1

## 2024-02-19 MED ORDER — ACETAMINOPHEN 500 MG PO TABS
1000.0000 mg | ORAL_TABLET | Freq: Once | ORAL | Status: AC
  Administered 2024-02-19: 1000 mg via ORAL
  Filled 2024-02-19: qty 2

## 2024-02-19 MED ORDER — ALUM & MAG HYDROXIDE-SIMETH 200-200-20 MG/5ML PO SUSP: 30.0000 mL | ORAL | Status: DC | PRN

## 2024-02-19 MED ORDER — OXYCODONE HCL 5 MG PO TABS
10.0000 mg | ORAL_TABLET | ORAL | Status: DC | PRN
  Administered 2024-02-20: 15 mg via ORAL
  Administered 2024-02-20 – 2024-02-21 (×2): 10 mg via ORAL
  Filled 2024-02-19: qty 2
  Filled 2024-02-19: qty 3

## 2024-02-19 MED ORDER — ASPIRIN 81 MG PO CHEW
81.0000 mg | CHEWABLE_TABLET | Freq: Two times a day (BID) | ORAL | Status: DC
  Administered 2024-02-19 – 2024-02-21 (×4): 81 mg via ORAL
  Filled 2024-02-19 (×5): qty 1

## 2024-02-19 MED ORDER — AMLODIPINE BESYLATE 5 MG PO TABS
5.0000 mg | ORAL_TABLET | Freq: Every day | ORAL | Status: DC
  Filled 2024-02-19 (×2): qty 1

## 2024-02-19 MED ORDER — METOCLOPRAMIDE HCL 10 MG PO TABS: 5.0000 mg | ORAL_TABLET | Freq: Three times a day (TID) | ORAL | Status: DC | PRN

## 2024-02-19 MED ORDER — CLONIDINE HCL (ANALGESIA) 100 MCG/ML EP SOLN
EPIDURAL | Status: DC | PRN
  Administered 2024-02-19: 50 ug

## 2024-02-19 MED ORDER — PROPOFOL 10 MG/ML IV BOLUS
INTRAVENOUS | Status: DC | PRN
  Administered 2024-02-19: 20 mg via INTRAVENOUS
  Administered 2024-02-19: 50 mg via INTRAVENOUS
  Administered 2024-02-19: 20 mg via INTRAVENOUS

## 2024-02-19 MED ORDER — OXYCODONE HCL 5 MG PO TABS
ORAL_TABLET | ORAL | Status: AC
  Filled 2024-02-19: qty 1

## 2024-02-19 MED ORDER — SPIRONOLACTONE 25 MG PO TABS
25.0000 mg | ORAL_TABLET | Freq: Every day | ORAL | Status: DC
  Administered 2024-02-20: 25 mg via ORAL
  Filled 2024-02-19 (×3): qty 1

## 2024-02-19 MED ORDER — BUPIVACAINE-EPINEPHRINE 0.25% -1:200000 IJ SOLN
INTRAMUSCULAR | Status: DC | PRN
  Administered 2024-02-19: 30 mL

## 2024-02-19 MED ORDER — DEXAMETHASONE SOD PHOSPHATE PF 10 MG/ML IJ SOLN
INTRAMUSCULAR | Status: AC
  Filled 2024-02-19: qty 1

## 2024-02-19 MED ORDER — FAMOTIDINE 20 MG PO TABS
10.0000 mg | ORAL_TABLET | Freq: Every day | ORAL | Status: DC
  Administered 2024-02-20 – 2024-02-21 (×2): 10 mg via ORAL
  Filled 2024-02-19 (×3): qty 1

## 2024-02-19 MED ORDER — DIPHENHYDRAMINE HCL 12.5 MG/5ML PO ELIX: 12.5000 mg | ORAL_SOLUTION | ORAL | Status: DC | PRN

## 2024-02-19 MED ORDER — FENTANYL CITRATE (PF) 50 MCG/ML IJ SOSY
50.0000 ug | PREFILLED_SYRINGE | Freq: Once | INTRAMUSCULAR | Status: AC
  Administered 2024-02-19: 50 ug via INTRAVENOUS
  Filled 2024-02-19: qty 2

## 2024-02-19 MED ORDER — CYCLOBENZAPRINE HCL 10 MG PO TABS
10.0000 mg | ORAL_TABLET | Freq: Three times a day (TID) | ORAL | Status: DC | PRN
  Administered 2024-02-20: 10 mg via ORAL
  Filled 2024-02-19: qty 1

## 2024-02-19 MED ORDER — ONDANSETRON HCL 4 MG/2ML IJ SOLN
INTRAMUSCULAR | Status: DC | PRN
  Administered 2024-02-19: 4 mg via INTRAVENOUS

## 2024-02-19 MED ORDER — POVIDONE-IODINE 10 % EX SWAB
2.0000 | Freq: Once | CUTANEOUS | Status: AC
  Administered 2024-02-19: 2 via TOPICAL

## 2024-02-19 MED ORDER — PROPOFOL 1000 MG/100ML IV EMUL
INTRAVENOUS | Status: AC
  Filled 2024-02-19: qty 100

## 2024-02-19 MED ORDER — PROPOFOL 500 MG/50ML IV EMUL
INTRAVENOUS | Status: DC | PRN
  Administered 2024-02-19: 50 ug/kg/min via INTRAVENOUS

## 2024-02-19 MED ORDER — ACETAMINOPHEN 325 MG PO TABS
325.0000 mg | ORAL_TABLET | Freq: Four times a day (QID) | ORAL | Status: DC | PRN
  Administered 2024-02-20: 650 mg via ORAL
  Filled 2024-02-19: qty 2

## 2024-02-19 MED ORDER — MUPIROCIN 2 % EX OINT: 1.0000 | TOPICAL_OINTMENT | Freq: Two times a day (BID) | CUTANEOUS | 0 refills | Status: DC

## 2024-02-19 MED ORDER — ONDANSETRON HCL 4 MG/2ML IJ SOLN
4.0000 mg | Freq: Four times a day (QID) | INTRAMUSCULAR | Status: DC | PRN
  Administered 2024-02-20: 4 mg via INTRAVENOUS
  Filled 2024-02-19: qty 2

## 2024-02-19 MED ORDER — DOXAZOSIN MESYLATE 2 MG PO TABS
4.0000 mg | ORAL_TABLET | Freq: Every day | ORAL | Status: DC
  Filled 2024-02-19 (×2): qty 2

## 2024-02-19 MED ORDER — POLYETHYLENE GLYCOL 3350 17 G PO PACK: 17.0000 g | PACK | Freq: Every day | ORAL | Status: DC | PRN

## 2024-02-19 MED ORDER — ALBUTEROL SULFATE (2.5 MG/3ML) 0.083% IN NEBU: 3.0000 mL | INHALATION_SOLUTION | RESPIRATORY_TRACT | Status: DC | PRN

## 2024-02-19 MED ORDER — PROPOFOL 500 MG/50ML IV EMUL
INTRAVENOUS | Status: AC
  Filled 2024-02-19: qty 50

## 2024-02-19 MED ORDER — IRBESARTAN 300 MG PO TABS
300.0000 mg | ORAL_TABLET | Freq: Every day | ORAL | Status: DC
  Administered 2024-02-19 – 2024-02-20 (×2): 300 mg via ORAL
  Filled 2024-02-19 (×2): qty 1

## 2024-02-19 MED ORDER — FENTANYL CITRATE (PF) 50 MCG/ML IJ SOSY: 25.0000 ug | PREFILLED_SYRINGE | INTRAMUSCULAR | Status: DC | PRN

## 2024-02-19 MED ORDER — CHLORHEXIDINE GLUCONATE 4 % EX SOLN: 1.0000 | CUTANEOUS | 1 refills | Status: DC

## 2024-02-19 MED ORDER — GLYCOPYRROLATE 0.2 MG/ML IJ SOLN
INTRAMUSCULAR | Status: DC | PRN
  Administered 2024-02-19: .2 mg via INTRAVENOUS

## 2024-02-19 MED ORDER — OXYCODONE HCL 5 MG PO TABS
5.0000 mg | ORAL_TABLET | Freq: Once | ORAL | Status: AC | PRN
  Administered 2024-02-19: 5 mg via ORAL

## 2024-02-19 MED ORDER — DEXAMETHASONE SOD PHOSPHATE PF 10 MG/ML IJ SOLN
INTRAMUSCULAR | Status: DC | PRN
  Administered 2024-02-19: 10 mg via INTRAVENOUS

## 2024-02-19 NOTE — Interval H&P Note (Signed)
 History and Physical Interval Note: The patient understands that he is here today for a right total knee replacement to treat his significant right knee pain and arthritis.  There has been no acute or interval change in his medical status.  The risks and benefits of surgery have been discussed in detail and informed consent has been obtained.  The right operative knee has been marked.  02/19/2024 12:58 PM  Robert Haney  has presented today for surgery, with the diagnosis of Osteoarthritis Right Knee.  The various methods of treatment have been discussed with the patient and family. After consideration of risks, benefits and other options for treatment, the patient has consented to  Procedures: ARTHROPLASTY, KNEE, TOTAL (Right) as a surgical intervention.  The patient's history has been reviewed, patient examined, no change in status, stable for surgery.  I have reviewed the patient's chart and labs.  Questions were answered to the patient's satisfaction.     [name]

## 2024-02-19 NOTE — Anesthesia Procedure Notes (Signed)
 Date/Time: 02/19/2024 2:09 PM  Performed by: Therisa Doyal CROME, CRNAOxygen Delivery Method: Simple face mask

## 2024-02-19 NOTE — Anesthesia Procedure Notes (Signed)
 Spinal  Start time: 02/19/2024 2:12 PM End time: 02/19/2024 2:17 PM  Staffing Performed: resident/CRNA  Authorized by: Epifanio Charleston, MD   Performed by: Therisa Doyal CROME, CRNA  Preanesthetic Checklist Completed: patient identified, IV checked, site marked, risks and benefits discussed, surgical consent, monitors and equipment checked, pre-op evaluation and timeout performed Spinal Block Patient position: sitting Prep: DuraPrep and site prepped and draped Patient monitoring: heart rate, cardiac monitor, continuous pulse ox and blood pressure Approach: midline Location: L3-4 Injection technique: single-shot Needle Needle type: Pencan  Needle gauge: 24 G Needle length: 10 cm Assessment Sensory level: T8  Additional Notes Kit expiration date 10/03/2025, lot # [id number] Clear free flow CSF, negative heme, negative paresthesia Tolerated well and returned to supine position

## 2024-02-19 NOTE — Anesthesia Procedure Notes (Addendum)
 Anesthesia Regional Block: Adductor canal block   Pre-Anesthetic Checklist: , timeout performed,  Correct Patient, Correct Site, Correct Laterality,  Correct Procedure, Correct Position, site marked,  Risks and benefits discussed,  Surgical consent,  Pre-op evaluation,  At surgeon's request and post-op pain management  Laterality: Right  Prep: chloraprep       Needles:  Injection technique: Single-shot  Needle Type: Echogenic Needle     Needle Length: 9cm  Needle Gauge: 21     Additional Needles:   Procedures:,,,, ultrasound used (permanent image in chart),,    Narrative:  Start time: 02/19/2024 1:10 AM End time: 02/19/2024 1:14 AM Injection made incrementally with aspirations every 5 mL.  Performed by: Personally  Anesthesiologist: Epifanio Charleston, MD

## 2024-02-19 NOTE — Discharge Instructions (Signed)

## 2024-02-19 NOTE — Transfer of Care (Signed)
 Immediate Anesthesia Transfer of Care Note  Patient: Robert Haney  Procedure(s) Performed: ARTHROPLASTY, KNEE, TOTAL (Right: Knee)  Patient Location: PACU  Anesthesia Type:Spinal  Level of Consciousness: drowsy  Airway & Oxygen Therapy: Patient Spontanous Breathing and Patient connected to face mask oxygen  Post-op Assessment: Report given to RN, Post -op Vital signs reviewed and stable, and Patient moving all extremities X 4  Post vital signs: Reviewed and stable  Last Vitals:  Vitals Value Taken Time  BP 119/70 02/19/24 16:41  Temp    Pulse 43   Resp 20 02/19/24 16:42  SpO2 100   Vitals shown include unfiled device data.  Last Pain:  Vitals:   02/19/24 1315  TempSrc:   PainSc: 0-No pain         Complications: No notable events documented.

## 2024-02-19 NOTE — Progress Notes (Signed)
" °   02/19/24 2243  BiPAP/CPAP/SIPAP  $ Non-Invasive Home Ventilator  Initial  BiPAP/CPAP/SIPAP Pt Type Adult  BiPAP/CPAP/SIPAP Resmed  Mask Type Full face mask  Dentures removed? Not applicable  Mask Size Large  Respiratory Rate 18 breaths/min  FiO2 (%) 21 %  Patient Home Machine No  Patient Home Mask Yes  Patient Home Tubing Yes  Auto Titrate Yes  Minimum cmH2O 4 cmH2O  Maximum cmH2O 15 cmH2O  Nasal massage performed No (comment)  CPAP/SIPAP surface wiped down Yes  Device Plugged into RED Power Outlet Yes  BiPAP/CPAP /SiPAP Vitals  Pulse Rate (!) 40  Resp 18  SpO2 97 %  Bilateral Breath Sounds Clear;Diminished    "

## 2024-02-19 NOTE — Op Note (Signed)
 "    Operative Note  Date of operation: 02/19/2024 Preoperative diagnosis: Right knee primary osteoarthritis Postoperative diagnosis: Same  Procedure: Right press-fit total knee arthroplasty  Implants: DePuy attune press-fit medial stabilized CR knee system Implant Name Type Inv. Item Serial No. Manufacturer Lot No. LRB No. Used Action  COMPONENT FEM CMTLS ATTUN 5 RT - [id number] Joint COMPONENT FEM CMTLS ATTUN 5 RT  [employer] [id number] Right 1 Implanted  INSERT TIB CMT ATTUNE 5 - [id number] Insert INSERT TIB CMT ATTUNE 5  [employer] [id number] Right 1 Implanted  PATELLA MEDIALIZED Knees   [employer] [id number] Right 1 Implanted  INSERT TIB ATT 5 10 MED RT - [id number] Insert INSERT TIB ATT 5 10 MED RT  [employer]PU[employer] [id number] Right 1 Implanted   Surgeon: Lonni GRADE. Vernetta, MD Assistant: Tory Gaskins, PA-C  Anesthesia: #1 right lower extremity adductor canal block, #2 spinal, #3 local Tourniquet time: 81 minutes Antibiotics: IV Ancef  EBL: Less than 50 cc Complications: None  Indications: The patient is a 54 year old gentleman with debilitating arthritis involving his right knee.  He has had injuries to the knee in the past as well as arthroscopic interventions.  At this point he has significant arthritis in the knee and has tried and failed all forms conservative treatment.  His knee pain has become daily and it is detrimentally finding his mobility, his quality of life and actives daily living to the point of wishing to proceed with a knee replacement and we agree with this as well.  We did discuss the risks of acute blood loss anemia, nerve and vessel injury, fracture, infection, DVT, implant failure and wound healing issues.  He understands that our goals are hopefully decreased pain, improved mobility and improve quality of life.  Procedure description: After informed consent was obtained appropriate right knee was marked, anesthesia obtained a right lower  extremity adductor canal holding block in the holding room.  The patient was then brought to the operating room and set up on the operative leg will where spinal anesthesia was obtained.  He was then laid in spine position operating table and a Foley catheter was placed.  A nonsterile tourniquet was placed around his upper right thigh and his right thigh, knee, leg, and ankle were prepped and draped with DuraPrep and sterile drapes and a new sterile stockinette.  Timeout is called and he was then applied scrapers to correct right knee.  We then used a Esmarch wrap out the leg and the tourniquet was plated 300 mm pressure.  We then made a direct midline incision over the patella with the knee extended and carried this proximally distally.  We dissected down the knee joint carried out a medial parapatellar arthrotomy finding moderate joint effusion and significant arthritis in all 3 compartments of the right knee with some deformity of the patella as well.  Removed osteophytes from all 3 compartments as well as remanence of the medial and lateral meniscus.  The ACL was deficient and basically torn.  This was removed as well.  We then used an extramedullary based cutting guide for making her proximal tibia cut correcting her varus and valgus and a 5 degree slope.  We made this cut to take 2 mm off the low side and we did back it down to more millimeters.  We then used the intramedullary cutting guide for distal femur cut setting this for a right knee at 5 degrees external rotated and a 9 mm distal femoral cut.  We then brought the knee back down to full extension and had achieved full extension which is slight hyperextension with a 5 mm extension block.  We the back to the femur and put a femoral sizing guide based off the epicondylar axis.  Based off of this we chose a size 5 right femur.  We put a 4-in-1 cutting block for a size 5 right femur and made the anterior and posterior cuts and the chamfer cuts.  We then  backed the tibia and chose a size 5 tibial tray setting the rotation of the tibial tubercle and the femur.  Regular drill hole and keel punch over this and with good quality bone we did our preparation for press-fit implants.  We then trialed our tibia size 5 for a right knee followed by our size 5 right CR femur.  We trialed up to a 7 mm thickness medial stabilized polythene insert and we are pleased with range of motion and stability without insert.  We then made a patella cut which was certainly quite difficult and drilled 3 holes for a size 35 dome patella.  Again with all trials rotation knee we felt we had good stability and range of motion.  We then removed all trial traction from the knee and irrigate the knee with normal saline solution using pulsatile lavage.  We then placed Marcaine  with epinephrine  around the arthrotomy.  Next with the knee in a flexed position we placed our attune press-fit tibial tray size 5 followed by press fitting our size 5 right CR standard femur.  We placed our 7 mm thickness medial stabilized polythene insert and once we get that insert in place we really felt like that was not as stable as we would like so we trialed a 10 mm insert and went with a real 10 mm polythene insert.  We then press-fit our 35 dome patella button.  We then put the knee through range of motion and we did feel like the patella tracked laterally so we let the tourniquet down and hemostasis was obtained electrocautery.  Next we then tightened up our patella tendon area as well as our arthrotomy with closing it with #1 Vicryl suture and #1 Ethibond suture to try to tighten this area up.  We then closed the deep tissue 0 Vicryl followed by 2-0 Vicryl in subcutaneous tissue and interrupted staples on the skin.  Well-padded sterile dressings applied.  The patient was taken off the operating table and taken the recovery room.  Tory Gaskins, PA-C did assist in the entire case and beginning and his assistance was  crucial and medically necessary for soft tissue management and retraction, helping guide implant placement and a layered closure of the wound. "

## 2024-02-19 NOTE — Anesthesia Postprocedure Evaluation (Signed)
"   Anesthesia Post Note  Patient: Frans Valente  Procedure(s) Performed: ARTHROPLASTY, KNEE, TOTAL (Right: Knee)     Patient location during evaluation: PACU Anesthesia Type: Spinal Level of consciousness: awake and alert Pain management: pain level controlled Vital Signs Assessment: post-procedure vital signs reviewed and stable Respiratory status: spontaneous breathing and respiratory function stable Cardiovascular status: blood pressure returned to baseline and stable Postop Assessment: spinal receding Anesthetic complications: no   No notable events documented.  Last Vitals:  Vitals:   02/19/24 1846 02/19/24 1916  BP: 120/77 120/77  Pulse: (!) 37 (!) 37  Resp: 13 15  Temp:  (!) 36.4 C  SpO2: 99% 99%    Last Pain:  Vitals:   02/19/24 1846  TempSrc:   PainSc: 4                  Epifanio Lamar BRAVO      "

## 2024-02-20 DIAGNOSIS — M1711 Unilateral primary osteoarthritis, right knee: Secondary | ICD-10-CM | POA: Diagnosis not present

## 2024-02-20 LAB — CBC
HCT: 47.8 % (ref 39.0–52.0)
Hemoglobin: 15 g/dL (ref 13.0–17.0)
MCH: 24.2 pg — ABNORMAL LOW (ref 26.0–34.0)
MCHC: 31.4 g/dL (ref 30.0–36.0)
MCV: 77 fL — ABNORMAL LOW (ref 80.0–100.0)
Platelets: 237 K/uL (ref 150–400)
RBC: 6.21 MIL/uL — ABNORMAL HIGH (ref 4.22–5.81)
RDW: 15 % (ref 11.5–15.5)
WBC: 10.4 K/uL (ref 4.0–10.5)
nRBC: 0 % (ref 0.0–0.2)

## 2024-02-20 LAB — BASIC METABOLIC PANEL WITH GFR
Anion gap: 10 (ref 5–15)
BUN: 18 mg/dL (ref 6–20)
CO2: 24 mmol/L (ref 22–32)
Calcium: 9.5 mg/dL (ref 8.9–10.3)
Chloride: 103 mmol/L (ref 98–111)
Creatinine, Ser: 2.11 mg/dL — ABNORMAL HIGH (ref 0.61–1.24)
GFR, Estimated: 36 mL/min — ABNORMAL LOW
Glucose, Bld: 153 mg/dL — ABNORMAL HIGH (ref 70–99)
Potassium: 4.8 mmol/L (ref 3.5–5.1)
Sodium: 137 mmol/L (ref 135–145)

## 2024-02-20 MED ORDER — OXYCODONE HCL 5 MG PO TABS: 5.0000 mg | ORAL_TABLET | Freq: Four times a day (QID) | ORAL | 0 refills | Status: DC | PRN

## 2024-02-20 MED ORDER — ORAL CARE MOUTH RINSE: 15.0000 mL | OROMUCOSAL | Status: DC | PRN

## 2024-02-20 MED ORDER — ONDANSETRON 4 MG PO TBDP: 4.0000 mg | ORAL_TABLET | Freq: Three times a day (TID) | ORAL | 0 refills | Status: DC | PRN

## 2024-02-20 MED ORDER — PROMETHAZINE HCL 12.5 MG PO TABS
12.5000 mg | ORAL_TABLET | Freq: Four times a day (QID) | ORAL | Status: DC | PRN
  Filled 2024-02-20: qty 1

## 2024-02-20 MED ORDER — ASPIRIN 81 MG PO CHEW: 81.0000 mg | CHEWABLE_TABLET | Freq: Two times a day (BID) | ORAL | 0 refills | Status: DC

## 2024-02-20 MED ORDER — MUPIROCIN 2 % EX OINT: 1.0000 | TOPICAL_OINTMENT | Freq: Two times a day (BID) | CUTANEOUS | 0 refills | Status: DC

## 2024-02-20 MED ORDER — CHLORHEXIDINE GLUCONATE 4 % EX SOLN: 1.0000 | CUTANEOUS | 1 refills | Status: DC

## 2024-02-20 MED ORDER — CYCLOBENZAPRINE HCL 10 MG PO TABS: 10.0000 mg | ORAL_TABLET | Freq: Three times a day (TID) | ORAL | 0 refills | Status: DC | PRN

## 2024-02-20 NOTE — Progress Notes (Signed)
" °   02/20/24 2340  BiPAP/CPAP/SIPAP  BiPAP/CPAP/SIPAP Pt Type Adult  BiPAP/CPAP/SIPAP Resmed  Mask Type Full face mask  Dentures removed? Not applicable  Mask Size Large  Respiratory Rate 20 breaths/min  FiO2 (%) 21 %  Patient Home Machine No  Patient Home Mask Yes  Patient Home Tubing Yes  Auto Titrate Yes  Minimum cmH2O 4 cmH2O  Maximum cmH2O 15 cmH2O  Device Plugged into RED Power Outlet Yes  BiPAP/CPAP /SiPAP Vitals  Pulse Rate (!) 51  Resp 20  SpO2 93 %  Bilateral Breath Sounds Diminished;Clear    "

## 2024-02-20 NOTE — Plan of Care (Signed)
" °  Problem: Education: Goal: Knowledge of General Education information will improve Description: Including pain rating scale, medication(s)/side effects and non-pharmacologic comfort measures Outcome: Progressing   Problem: Health Behavior/Discharge Planning: Goal: Ability to manage health-related needs will improve Outcome: Progressing   Problem: Clinical Measurements: Goal: Ability to maintain clinical measurements within normal limits will improve Outcome: Progressing Goal: Will remain free from infection Outcome: Progressing Goal: Diagnostic test results will improve Outcome: Progressing Goal: Respiratory complications will improve Outcome: Progressing Goal: Cardiovascular complication will be avoided Outcome: Progressing   Problem: Activity: Goal: Risk for activity intolerance will decrease Outcome: Progressing   Problem: Nutrition: Goal: Adequate nutrition will be maintained Outcome: Progressing   Problem: Coping: Goal: Level of anxiety will decrease Outcome: Progressing   Problem: Elimination: Goal: Will not experience complications related to bowel motility Outcome: Progressing Goal: Will not experience complications related to urinary retention Outcome: Progressing   Problem: Pain Managment: Goal: General experience of comfort will improve and/or be controlled Outcome: Progressing   Problem: Safety: Goal: Ability to remain free from injury will improve Outcome: Progressing   Problem: Skin Integrity: Goal: Risk for impaired skin integrity will decrease Outcome: Progressing   Problem: Education: Goal: Knowledge of the prescribed therapeutic regimen will improve Outcome: Progressing   Problem: Bowel/Gastric: Goal: Gastrointestinal status for postoperative course will improve Outcome: Progressing   Problem: Cardiac: Goal: Ability to maintain an adequate cardiac output Outcome: Progressing Goal: Will show no evidence of cardiac arrhythmias Outcome:  Progressing   Problem: Nutritional: Goal: Will attain and maintain optimal nutritional status Outcome: Progressing   Problem: Neurological: Goal: Will regain or maintain usual level of consciousness Outcome: Progressing   Problem: Clinical Measurements: Goal: Ability to maintain clinical measurements within normal limits Outcome: Progressing Goal: Postoperative complications will be avoided or minimized Outcome: Progressing   Problem: Respiratory: Goal: Will regain and/or maintain adequate ventilation Outcome: Progressing Goal: Respiratory status will improve Outcome: Progressing   Problem: Skin Integrity: Goal: Demonstrates signs of wound healing without infection Outcome: Progressing   Problem: Urinary Elimination: Goal: Will remain free from infection Outcome: Progressing Goal: Ability to achieve and maintain adequate urine output Outcome: Progressing   Problem: Education: Goal: Knowledge of the prescribed therapeutic regimen will improve Outcome: Progressing Goal: Individualized Educational Video(s) Outcome: Progressing   Problem: Activity: Goal: Ability to avoid complications of mobility impairment will improve Outcome: Progressing Goal: Range of joint motion will improve Outcome: Progressing   Problem: Clinical Measurements: Goal: Postoperative complications will be avoided or minimized Outcome: Progressing   Problem: Pain Management: Goal: Pain level will decrease with appropriate interventions Outcome: Progressing   Problem: Skin Integrity: Goal: Will show signs of wound healing Outcome: Progressing   "

## 2024-02-20 NOTE — Progress Notes (Signed)
 Pt was able to walk with walker from bed to chair. The foley was removed this morning per order.

## 2024-02-20 NOTE — Evaluation (Signed)
 Physical Therapy Evaluation Patient Details Name: Robert Haney MRN: [id number] DOB: 05-Jun-1970 Today's Date: 02/20/2024  History of Present Illness  Pt is a 54 year old male s/p R TKA on 02/19/24.  PMHx: murmur, obesity, HTN, afib with RVR, CKD, vertigo  Clinical Impression  Pt is s/p TKA resulting in the deficits listed below (see PT Problem List).  Pt will benefit from acute skilled PT to increase their independence and safety with mobility to allow discharge.  Pt reports hx of bradycardia. HR 70 bpm with ambulation. Pt not able to perform SLR today so utilized KI.  Pt ambulated short distance in hallway limited by nausea and assisted back to bed with ice machine reapplied.  Pt anticipates d/c home with his children rotating assist and has 2 steps to enter home.          If plan is discharge home, recommend the following:     Can travel by private vehicle        Equipment Recommendations None recommended by PT  Recommendations for Other Services       Functional Status Assessment Patient has had a recent decline in their functional status and demonstrates the ability to make significant improvements in function in a reasonable and predictable amount of time.     Precautions / Restrictions Precautions Precautions: Fall;Knee Required Braces or Orthoses: Knee Immobilizer - Right Restrictions Weight Bearing Restrictions Per Provider Order: No Other Position/Activity Restrictions: WBAT      Mobility  Bed Mobility Overal bed mobility: Needs Assistance Bed Mobility: Sit to Supine       Sit to supine: Contact guard assist   General bed mobility comments: verbal cues for self assist    Transfers Overall transfer level: Needs assistance Equipment used: Rolling walker (2 wheels) Transfers: Sit to/from Stand Sit to Stand: Contact guard assist           General transfer comment: verbal cues for UE and LE positioning for pain control     Ambulation/Gait Ambulation/Gait assistance: Contact guard assist Gait Distance (Feet): 90 Feet Assistive device: Rolling walker (2 wheels) Gait Pattern/deviations: Step-to pattern, Decreased stance time - right, Antalgic Gait velocity: decr     General Gait Details: verbal cues for sequence, RW positioning, step length, distance limited by nausea; HR 70 bpm during ambulation  Stairs            Wheelchair Mobility     Tilt Bed    Modified Rankin (Stroke Patients Only)       Balance                                             Pertinent Vitals/Pain Pain Assessment Pain Assessment: 0-10 Pain Score: 7  Pain Location: right knee Pain Descriptors / Indicators: Aching, Sore Pain Intervention(s): Monitored during session, Repositioned, Ice applied    Home Living Family/patient expects to be discharged to:: Private residence Living Arrangements: Alone Available Help at Discharge: Family;Available 24 hours/day (pt's children are rotating taking care of him upon d/c) Type of Home: House Home Access: Stairs to enter Entrance Stairs-Rails: None Entrance Stairs-Number of Steps: 2   Home Layout: One level Home Equipment: Agricultural Consultant (2 wheels)      Prior Function Prior Level of Function : Independent/Modified Independent  Extremity/Trunk Assessment        Lower Extremity Assessment Lower Extremity Assessment: RLE deficits/detail RLE Deficits / Details: unable to perform SLR, maintained KI, able to perform ankle pumps       Communication   Communication Communication: No apparent difficulties    Cognition Arousal: Alert Behavior During Therapy: WFL for tasks assessed/performed   PT - Cognitive impairments: No apparent impairments                         Following commands: Intact       Cueing       General Comments      Exercises     Assessment/Plan    PT Assessment Patient needs  continued PT services  PT Problem List Decreased strength;Decreased mobility;Decreased range of motion;Decreased knowledge of use of DME;Pain;Decreased knowledge of precautions;Decreased activity tolerance       PT Treatment Interventions Stair training;DME instruction;Gait training;Therapeutic exercise;Functional mobility training;Therapeutic activities;Patient/family education;Balance training    PT Goals (Current goals can be found in the Care Plan section)  Acute Rehab PT Goals PT Goal Formulation: With patient Time For Goal Achievement: 02/27/24 Potential to Achieve Goals: Good    Frequency 7X/week     Co-evaluation               AM-PAC PT 6 Clicks Mobility  Outcome Measure Help needed turning from your back to your side while in a flat bed without using bedrails?: A Little Help needed moving from lying on your back to sitting on the side of a flat bed without using bedrails?: A Little Help needed moving to and from a bed to a chair (including a wheelchair)?: A Little Help needed standing up from a chair using your arms (e.g., wheelchair or bedside chair)?: A Little Help needed to walk in hospital room?: A Little Help needed climbing 3-5 steps with a railing? : A Little 6 Click Score: 18    End of Session Equipment Utilized During Treatment: Gait belt Activity Tolerance: Patient tolerated treatment well Patient left: in bed;with call bell/phone within reach;with SCD's reapplied Nurse Communication: Mobility status PT Visit Diagnosis: Difficulty in walking, not elsewhere classified (R26.2);Pain Pain - Right/Left: Right Pain - part of body: Knee    Time: 8852-8787 PT Time Calculation (min) (ACUTE ONLY): 25 min   Charges:   PT Evaluation $PT Eval Low Complexity: 1 Low PT Treatments $Gait Training: 8-22 mins PT General Charges $$ ACUTE PT VISIT: 1 Visit        Tari PT, DPT Physical Therapist Acute Rehabilitation Services Office: [phone]   Kati  L Payson 02/20/2024, 1:25 PM

## 2024-02-20 NOTE — Progress Notes (Signed)
 Subjective: 1 Day Post-Op Procedures (LRB): ARTHROPLASTY, KNEE, TOTAL (Right) Patient reports pain as moderate.  Has had significant nausea.  Objective: Vital signs in last 24 hours: Temp:  [97.4 F (36.3 C)-98.8 F (37.1 C)] 98.4 F (36.9 C) (01/17 0614) Pulse Rate:  [35-59] 59 (01/17 0614) Resp:  [13-19] 16 (01/17 0614) BP: (93-133)/(55-82) 112/56 (01/17 0614) SpO2:  [95 %-100 %] 99 % (01/17 0614) FiO2 (%):  [21 %] 21 % (01/16 2243) Weight:  [94.8 kg-95.2 kg] 95.2 kg (01/16 2259)  Intake/Output from previous day: 01/16 0701 - 01/17 0700 In: 2239.2 [I.V.:1839.2; IV Piggyback:400] Out: 2275 [Urine:2225; Blood:50] Intake/Output this shift: No intake/output data recorded.  Recent Labs    02/19/24 1225 02/20/24 0533  HGB 15.1 15.0   Recent Labs    02/19/24 1225 02/20/24 0533  WBC 3.4* 10.4  RBC 6.21* 6.21*  HCT 48.2 47.8  PLT 236 237   Recent Labs    02/19/24 1225 02/20/24 0533  NA 138 137  K 4.4 4.8  CL 103 103  CO2 26 24  BUN 16 18  CREATININE 1.96* 2.11*  GLUCOSE 83 153*  CALCIUM 9.2 9.5   No results for input(s): LABPT, INR in the last 72 hours.  Sensation intact distally Intact pulses distally Dorsiflexion/Plantar flexion intact Incision: dressing C/D/I Compartment soft   Assessment/Plan: 1 Day Post-Op Procedures (LRB): ARTHROPLASTY, KNEE, TOTAL (Right) Up with therapy Plan for discharge tomorrow Discharge home with home health  Will need to work on his mobility and nausea today.    [name] 02/20/2024, 9:09 AM

## 2024-02-20 NOTE — Progress Notes (Signed)
 Physical Therapy Treatment Patient Details Name: Robert Haney MRN: [id number] DOB: 05/05/1970 Today's Date: 02/20/2024   History of Present Illness Pt is a 54 year old male s/p R TKA on 02/19/24.  PMHx: murmur, obesity, HTN, afib with RVR, CKD, vertigo    PT Comments  Pt slower to mobilize this afternoon however reporting 10/10 right knee pain.  Pt was agreeable to ambulate as tolerated and then returned to bed.  RN notified of pain and brought pain meds.     If plan is discharge home, recommend the following:     Can travel by private vehicle        Equipment Recommendations  None recommended by PT    Recommendations for Other Services       Precautions / Restrictions Precautions Precautions: Fall;Knee Required Braces or Orthoses: Knee Immobilizer - Right Restrictions Weight Bearing Restrictions Per Provider Order: No Other Position/Activity Restrictions: WBAT     Mobility  Bed Mobility Overal bed mobility: Needs Assistance Bed Mobility: Supine to Sit, Sit to Supine     Supine to sit: Contact guard Sit to supine: Min assist   General bed mobility comments: increased time and effort due to pain, pt able to self assist with gait belt over EOB however required min assist for R LE onto bed    Transfers Overall transfer level: Needs assistance Equipment used: Rolling walker (2 wheels) Transfers: Sit to/from Stand Sit to Stand: Contact guard assist           General transfer comment: verbal cues for UE and LE positioning for pain control    Ambulation/Gait Ambulation/Gait assistance: Contact guard assist Gait Distance (Feet): 90 Feet Assistive device: Rolling walker (2 wheels) Gait Pattern/deviations: Step-to pattern, Decreased stance time - right, Antalgic Gait velocity: decr     General Gait Details: verbal cues for sequence, RW positioning, step length, distance limited by pain   Stairs             Wheelchair Mobility     Tilt Bed     Modified Rankin (Stroke Patients Only)       Balance                                            Communication Communication Communication: No apparent difficulties  Cognition Arousal: Alert Behavior During Therapy: WFL for tasks assessed/performed   PT - Cognitive impairments: No apparent impairments                         Following commands: Intact      Cueing    Exercises Total Joint Exercises Knee Flexion: AROM, Right, 10 reps, Seated, Limitations Knee Flexion Limitations: limited to <30* due to pain    General Comments        Pertinent Vitals/Pain Pain Assessment Pain Assessment: 0-10 Pain Score: 10-Worst pain ever Pain Location: right knee Pain Descriptors / Indicators: Aching, Sore Pain Intervention(s): Monitored during session, Repositioned, Patient requesting pain meds-RN notified (pt had tylenol , RN brought pain meds end of session due to 10/10 pain)    Home Living Family/patient expects to be discharged to:: Private residence Living Arrangements: Alone Available Help at Discharge: Family;Available 24 hours/day (pt's children are rotating taking care of him upon d/c) Type of Home: House Home Access: Stairs to enter Entrance Stairs-Rails: None Entrance Stairs-Number of Steps: 2  Home Layout: One level Home Equipment: Agricultural Consultant (2 wheels)      Prior Function            PT Goals (current goals can now be found in the care plan section) Acute Rehab PT Goals PT Goal Formulation: With patient Time For Goal Achievement: 02/27/24 Potential to Achieve Goals: Good Progress towards PT goals: Progressing toward goals    Frequency    7X/week      PT Plan      Co-evaluation              AM-PAC PT 6 Clicks Mobility   Outcome Measure  Help needed turning from your back to your side while in a flat bed without using bedrails?: A Little Help needed moving from lying on your back to sitting on the side  of a flat bed without using bedrails?: A Little Help needed moving to and from a bed to a chair (including a wheelchair)?: A Little Help needed standing up from a chair using your arms (e.g., wheelchair or bedside chair)?: A Little Help needed to walk in hospital room?: A Little Help needed climbing 3-5 steps with a railing? : A Little 6 Click Score: 18    End of Session Equipment Utilized During Treatment: Gait belt Activity Tolerance: Patient limited by pain Patient left: in bed;with call bell/phone within reach;with SCD's reapplied;with nursing/sitter in room Nurse Communication: Mobility status;Patient requests pain meds PT Visit Diagnosis: Difficulty in walking, not elsewhere classified (R26.2);Pain Pain - Right/Left: Right Pain - part of body: Knee     Time: 1530-1554 PT Time Calculation (min) (ACUTE ONLY): 24 min  Charges:    $Gait Training: 23-37 mins PT General Charges $$ ACUTE PT VISIT: 1 Visit                     Tari KLEIN, DPT Physical Therapist Acute Rehabilitation Services Office: [phone]    Tari CROME Payson 02/20/2024, 4:19 PM

## 2024-02-21 ENCOUNTER — Other Ambulatory Visit (HOSPITAL_COMMUNITY): Payer: Self-pay

## 2024-02-21 DIAGNOSIS — M1711 Unilateral primary osteoarthritis, right knee: Secondary | ICD-10-CM | POA: Diagnosis not present

## 2024-02-21 MED ORDER — CYCLOBENZAPRINE HCL 10 MG PO TABS
10.0000 mg | ORAL_TABLET | Freq: Three times a day (TID) | ORAL | 0 refills | Status: AC | PRN
  Filled 2024-02-21: qty 30, 10d supply, fill #0

## 2024-02-21 MED ORDER — CHLORHEXIDINE GLUCONATE 4 % EX SOLN
1.0000 | CUTANEOUS | 1 refills | Status: AC
  Filled 2024-02-21: qty 946, 30d supply, fill #0

## 2024-02-21 MED ORDER — MUPIROCIN 2 % EX OINT
1.0000 | TOPICAL_OINTMENT | Freq: Two times a day (BID) | CUTANEOUS | 0 refills | Status: AC
  Filled 2024-02-21: qty 66, 42d supply, fill #0

## 2024-02-21 MED ORDER — ASPIRIN 81 MG PO CHEW
81.0000 mg | CHEWABLE_TABLET | Freq: Two times a day (BID) | ORAL | 0 refills | Status: AC
  Filled 2024-02-21: qty 30, 15d supply, fill #0

## 2024-02-21 MED ORDER — OXYCODONE HCL 5 MG PO TABS
5.0000 mg | ORAL_TABLET | Freq: Four times a day (QID) | ORAL | 0 refills | Status: DC | PRN
  Filled 2024-02-21: qty 30, 4d supply, fill #0

## 2024-02-21 MED ORDER — ONDANSETRON 4 MG PO TBDP
4.0000 mg | ORAL_TABLET | Freq: Three times a day (TID) | ORAL | 0 refills | Status: AC | PRN
  Filled 2024-02-21: qty 20, 7d supply, fill #0

## 2024-02-21 NOTE — Discharge Summary (Signed)
 Patient ID: Robert Haney MRN: [id number] DOB/AGE: Aug 05, 1970 54 y.o.  Admit date: 02/19/2024 Discharge date: 02/21/2024  Admission Diagnoses:  Principal Problem:   Unilateral primary osteoarthritis, right knee Active Problems:   Status post total right knee replacement   Discharge Diagnoses:  Same  Past Medical History:  Diagnosis Date   Anxiety    Arthritis    Atrial fibrillation with RVR (HCC)    Chronic kidney disease    Elevated BP    elevated BP readings-2010-------- normalized when rechecked 12/01/08   Elevated LDL cholesterol level     (normal TSH 2008)   Essential hypertension 09/27/2015   GERD (gastroesophageal reflux disease)    Hyperlipidemia 09/27/2015   Hypertension, essential    Leukocytopenia    Mild leukocytopenia- work up including Bone Marrow bx done, normal variant- Dr. gatha [facility] 8/11   Low testosterone    (symptomatic)--2.60 03/2008   Morbid obesity (HCC) 09/27/2015   Murmur 09/27/2015   Overweight    Palpitations 09/27/2015   Pre-diabetes    Vertigo     Surgeries: Procedures: ARTHROPLASTY, KNEE, TOTAL on 02/19/2024   Consultants:   Discharged Condition: Improved  Hospital Course: Robert Haney is an 54 y.o. male who was admitted 02/19/2024 for operative treatment ofUnilateral primary osteoarthritis, right knee. Patient has severe unremitting pain that affects sleep, daily activities, and work/hobbies. After pre-op clearance the patient was taken to the operating room on 02/19/2024 and underwent  Procedures: ARTHROPLASTY, KNEE, TOTAL.    Patient was given perioperative antibiotics:  Anti-infectives (From admission, onward)    Start     Dose/Rate Route Frequency Ordered Stop   02/19/24 2000  ceFAZolin  (ANCEF ) IVPB 2g/100 mL premix        2 g 200 mL/hr over 30 Minutes Intravenous Every 6 hours 02/19/24 1932 02/20/24 0239   02/19/24 1145  ceFAZolin  (ANCEF ) IVPB 2g/100 mL premix        2 g 200 mL/hr over 30 Minutes  Intravenous On call to O.R. 02/19/24 1136 02/19/24 1429        Patient was given sequential compression devices, early ambulation, and chemoprophylaxis to prevent DVT.  Inpatient Morphine Milligram Equivalents Per Day 1/16 - 1/18   Values displayed are in units of MME/Day    Order Start / End Date 1/16 Yesterday Today    oxyCODONE  (Oxy IR/ROXICODONE ) immediate release tablet 5 mg 1/16 - 1/16 7.5 of Unknown -- --    oxyCODONE  (ROXICODONE ) 5 MG/5ML solution 5 mg 1/16 - 1/16 0 of Unknown -- --      Group total: 7.5 of Unknown      fentaNYL  (SUBLIMAZE ) injection 25-50 mcg 1/16 - 1/16 0 of 45-90 -- --    fentaNYL  (SUBLIMAZE ) injection 50-100 mcg 1/16 - 1/16 15 of 15-30 -- --    HYDROmorphone  (DILAUDID ) injection 0.5-1 mg 1/16 - No end date 20 of 20-40 20 of 60-120 0 of 60-120    oxyCODONE  (Oxy IR/ROXICODONE ) immediate release tablet 5-10 mg 1/16 - No end date 0 of 15-30 15 of 45-90 15 of 45-90    oxyCODONE  (Oxy IR/ROXICODONE ) immediate release tablet 10-15 mg 1/16 - No end date 0 of 30-45 37.5 of 90-135 15 of 90-135    Daily Totals  42.5 of Unknown (at least 125-235) 72.5 of 195-345 30 of 195-345    Calculation Errors     Order Type Date Details   oxyCODONE  (Oxy IR/ROXICODONE ) immediate release tablet 5 mg Ordered Dose -- Insufficient frequency information   oxyCODONE  (  ROXICODONE ) 5 MG/5ML solution 5 mg Ordered Dose -- Insufficient frequency information            Patient benefited maximally from hospital stay and there were no complications.    Recent vital signs: Patient Vitals for the past 24 hrs:  BP Temp Temp src Pulse Resp SpO2  02/21/24 0849 136/66 98.7 F (37.1 C) Oral (!) 52 14 100 %  02/21/24 0438 132/72 99 F (37.2 C) Oral (!) 52 19 96 %  02/20/24 2340 -- -- -- (!) 51 20 93 %  02/20/24 2040 120/71 98.6 F (37 C) Oral (!) 52 18 95 %  02/20/24 1801 (!) 145/73 98.6 F (37 C) Oral 62 17 95 %  02/20/24 1526 121/62 98.2 F (36.8 C) Oral (!) 46 18 100 %     Recent  laboratory studies:  Recent Labs    02/19/24 1225 02/20/24 0533  WBC 3.4* 10.4  HGB 15.1 15.0  HCT 48.2 47.8  PLT 236 237  NA 138 137  K 4.4 4.8  CL 103 103  CO2 26 24  BUN 16 18  CREATININE 1.96* 2.11*  GLUCOSE 83 153*  CALCIUM 9.2 9.5     Discharge Medications:   Allergies as of 02/21/2024       Reactions   Hydrochlorothiazide    Other reaction(s): headaches        Medication List     TAKE these medications    acetaminophen  650 MG CR tablet Commonly known as: TYLENOL  Take 650 mg by mouth 2 (two) times daily as needed for pain.   albuterol  108 (90 Base) MCG/ACT inhaler Commonly known as: VENTOLIN  HFA Inhale 2 puffs into the lungs every 4 (four) hours as needed for shortness of breath or wheezing.   amLODipine  5 MG tablet Commonly known as: NORVASC  Take 5 mg by mouth daily.   aspirin  81 MG chewable tablet Chew 1 tablet (81 mg total) by mouth 2 (two) times daily.   atorvastatin 10 MG tablet Commonly known as: LIPITOR Take 10 mg by mouth every evening.   BENADRYL  PO Take 25 mg by mouth 2 (two) times daily as needed (allergies).   chlorhexidine  4 % external liquid Commonly known as: HIBICLENS  Apply 15 mLs (1 Application total) topically as directed for 30 doses. Use as directed daily for 5 days every other week for 6 weeks.   cyclobenzaprine  10 MG tablet Commonly known as: FLEXERIL  Take 1 tablet (10 mg total) by mouth 3 (three) times daily as needed for muscle spasms.   diltiazem  30 MG tablet Commonly known as: Cardizem  Take 1 tablet every 4 hours AS NEEDED for heart rate >100 What changed:  how much to take how to take this when to take this reasons to take this   doxazosin  4 MG tablet Commonly known as: CARDURA  TAKE 1 TABLET(4 MG) BY MOUTH DAILY What changed: See the new instructions.   empagliflozin  25 MG Tabs tablet Commonly known as: JARDIANCE  Take 12.5 mg by mouth daily.   Farxiga 10 MG Tabs tablet Generic drug: dapagliflozin  propanediol Take 10 mg by mouth every morning.   irbesartan  300 MG tablet Commonly known as: AVAPRO  Take 300 mg by mouth at bedtime.   meloxicam  7.5 MG tablet Commonly known as: MOBIC  TAKE 1 TABLET(7.5 MG) BY MOUTH DAILY What changed: See the new instructions.   mupirocin  ointment 2 % Commonly known as: BACTROBAN  Place 1 Application into the nose 2 (two) times daily for 60 doses. Use as directed 2  times daily for 5 days every other week for 6 weeks.   ondansetron  4 MG disintegrating tablet Commonly known as: ZOFRAN -ODT Take 1 tablet (4 mg total) by mouth every 8 (eight) hours as needed.   oxyCODONE  5 MG immediate release tablet Commonly known as: Oxy IR/ROXICODONE  Take 1-2 tablets (5-10 mg total) by mouth every 6 (six) hours as needed for moderate pain (pain score 4-6) (pain score 4-6).   Pepcid  AC 10 MG tablet Generic drug: famotidine  Take 10 mg by mouth daily.   spironolactone  25 MG tablet Commonly known as: ALDACTONE  Take 25 mg by mouth daily.   UNABLE TO FIND CPAP   Wegovy 0.25 MG/0.5ML Soaj SQ injection Generic drug: semaglutide-weight management Inject 0.25 mg into the skin once a week.   ZANTAC PO Take 1 tablet by mouth daily. Zantac 150 MG Tablet 1 tablet every other day               Durable Medical Equipment  (From admission, onward)           Start     Ordered   02/19/24 1933  DME 3 n 1  Once        02/19/24 1932   02/19/24 1933  DME Walker rolling  Once       Question Answer Comment  Walker: With 5 Inch Wheels   Patient needs a walker to treat with the following condition Status post total right knee replacement      02/19/24 1932            Diagnostic Studies: DG Knee Right Port Result Date: 02/19/2024 CLINICAL DATA:  Post knee replacement EXAM: PORTABLE RIGHT KNEE - 1-2 VIEW COMPARISON:  11/25/2023 FINDINGS: Status post right knee replacement with intact hardware and normal alignment. Gas in the soft tissues consistent with  recent surgery. IMPRESSION: Status post right knee replacement with expected postsurgical change. Electronically Signed   By: Luke Bun M.D.   On: 02/19/2024 19:51    Disposition: Discharge disposition: 06-Home-Health Care Svc          Contact information for follow-up providers     Vernetta Lonni GRADE, MD Follow up on 03/03/2024.   Specialty: Orthopedic Surgery Contact information: [address] [place] [address] [phone]              Contact information for after-discharge care     Home Medical Care     [facility] [place] [facility]) .   Service: Home Health Services Contact information: [address] [place] [address] [place]  [address] [phone]                      Signed: BERTRUM GASKINS 02/21/2024, 12:45 PM

## 2024-02-21 NOTE — TOC Initial Note (Signed)
 Transition of Care [facility]) - Initial/Assessment Note    Patient Details  Name: Robert Haney MRN: [id number] Date of Birth: 02/13/1970  Transition of Care [facility]) CM/SW Contact:    Doneta Glenys DASEN, RN Phone Number: 02/21/2024, 11:01 AM  Clinical Narrative:                 [place]. PTA: Patient lives in a house alone;DME-walker, 3in1;denies HH,oxygen and SDOH needs. Agreeable to HH-PT without preference. CM contacted [facility]. [facility] forms completed and emailed. Patients son to transport home. IP CM signing off Expected Discharge Plan: Home w Home Health Services Barriers to Discharge: Barriers Resolved   Patient Goals and CMS Choice Patient states their goals for this hospitalization and ongoing recovery are:: Home with [facility] CMS Medicare.gov Compare Post Acute Care list provided to:: Patient Choice offered to / list presented to : Patient [place] ownership interest in [facility].provided to:: Patient    Expected Discharge Plan and Services In-house Referral: NA Discharge Planning Services: CM Consult   Living arrangements for the past 2 months: Single Family Home                 DME Arranged: N/A         HH Arranged: PT HH [facility]: [facility] Date HH Agency Contacted: 02/21/24 Time HH Agency Contacted: 1100 Representative spoke with at [facility] Agency: [facility]  Prior Living Arrangements/Services Living arrangements for the past 2 months: Single Family Home Lives with:: Self Patient language and need for interpreter reviewed:: Yes Do you feel safe going back to the place where you live?: Yes      Need for Family Participation in Patient Care: Yes (Comment) Care giver support system in place?: Yes (comment) Current home services: DME (cane,walker, BSC) Criminal Activity/Legal Involvement Pertinent to Current Situation/Hospitalization: No - Comment as needed  Activities of Daily Living   ADL Screening (condition at time of  admission) Independently performs ADLs?: Yes (appropriate for developmental age) Is the patient deaf or have difficulty hearing?: No Does the patient have difficulty seeing, even when wearing glasses/contacts?: No Does the patient have difficulty concentrating, remembering, or making decisions?: No  Permission Sought/Granted Permission sought to share information with : Case Manager Permission granted to share information with : Yes, Verbal Permission Granted  Share Information with NAME: Pool Bitters  Daughter, Emergency Contact  [phone]  Permission granted to share info w AGENCY: HH        Emotional Assessment Appearance:: Appears stated age Attitude/Demeanor/Rapport: Engaged Affect (typically observed): Appropriate Orientation: : Oriented to Situation, Oriented to  Time, Oriented to Place, Oriented to Self Alcohol / Substance Use: Not Applicable Psych Involvement: No (comment)  Admission diagnosis:  Primary osteoarthritis of right knee [M17.11] Status post total right knee replacement [Z96.651] Patient Active Problem List   Diagnosis Date Noted   Status post total right knee replacement 02/19/2024   Unilateral primary osteoarthritis, right knee 12/28/2023   Kidney disease 09/23/2022   Paroxysmal atrial fibrillation (HCC) 10/22/2020   Palpitations 09/27/2015   Essential hypertension 09/27/2015   Murmur 09/27/2015   Hyperlipidemia 09/27/2015   Morbid obesity (HCC) 09/27/2015   PCP:  [facility] Pharmacy:   [id number] [place], [place] - [address] AT [facility] OF [facility] & [address] [place] [address] Phone: [phone] Fax: [phone]  [facility] - [place], [place] - [address] [facility] [place]  [address] Phone: [phone] Fax: [phone]     Social Drivers of Health (SDOH) Social History: SDOH Screenings   Food  Insecurity: No Food Insecurity (02/19/2024)  Housing: Low Risk (02/19/2024)  Transportation Needs: No Transportation Needs (02/19/2024)  Utilities: Not At Risk (02/19/2024)  Tobacco Use: Low Risk (02/19/2024)   SDOH Interventions:     Readmission Risk Interventions     No data to display

## 2024-02-21 NOTE — Progress Notes (Signed)
 Physical Therapy Treatment Patient Details Name: Robert Haney MRN: [id number] DOB: 1970/11/18 Today's Date: 02/21/2024   History of Present Illness Pt is a 54 year old male s/p R TKA on 02/19/24.  PMHx: murmur, obesity, HTN, afib with RVR, CKD, vertigo    PT Comments  Pt ambulated in hallway, practiced safe stair technique, and performed LE exercises.  Pt will have son available to assist upon d/c.  Pt provided with stair and HEP handouts.  Pt ready for d/c home today, plans to f/u with HHPT. Pt had no further questions.     If plan is discharge home, recommend the following:     Can travel by private vehicle        Equipment Recommendations  None recommended by PT    Recommendations for Other Services       Precautions / Restrictions Precautions Precautions: Fall;Knee Restrictions Other Position/Activity Restrictions: WBAT     Mobility  Bed Mobility Overal bed mobility: Needs Assistance Bed Mobility: Supine to Sit, Sit to Supine     Supine to sit: Supervision Sit to supine: Supervision   General bed mobility comments: increased time and effort due to pain, pt able to self assist with gait belt    Transfers Overall transfer level: Needs assistance Equipment used: Rolling walker (2 wheels) Transfers: Sit to/from Stand Sit to Stand: Contact guard assist, Supervision           General transfer comment: verbal cues for UE and LE positioning for pain control    Ambulation/Gait Ambulation/Gait assistance: Contact guard assist, Supervision Gait Distance (Feet): 80 Feet Assistive device: Rolling walker (2 wheels) Gait Pattern/deviations: Step-to pattern, Decreased stance time - right, Antalgic Gait velocity: decr     General Gait Details: verbal cues for sequence, RW positioning, step length, right knee flexion as tolerated   Stairs Stairs: Yes Stairs assistance: Contact guard assist Stair Management: Step to pattern, Backwards, With walker Number of  Stairs: 3 General stair comments: verbal cues for sequence, RW positioning; performed once and reported understanding; provided stair technique handout   Wheelchair Mobility     Tilt Bed    Modified Rankin (Stroke Patients Only)       Balance                                            Communication Communication Communication: No apparent difficulties  Cognition Arousal: Alert Behavior During Therapy: WFL for tasks assessed/performed   PT - Cognitive impairments: No apparent impairments                         Following commands: Intact      Cueing    Exercises Total Joint Exercises Ankle Circles/Pumps: AROM, Both, 10 reps Quad Sets: AROM, Right, 10 reps Heel Slides: AAROM, Right, 10 reps Hip ABduction/ADduction: AAROM, Right, 10 reps Straight Leg Raises: AAROM, Right, 10 reps    General Comments        Pertinent Vitals/Pain Pain Assessment Pain Assessment: 0-10 Pain Score: 8  Pain Location: right knee Pain Descriptors / Indicators: Aching, Sore Pain Intervention(s): Premedicated before session, Repositioned, Monitored during session, Ice applied    Home Living                          Prior Function  PT Goals (current goals can now be found in the care plan section) Progress towards PT goals: Progressing toward goals    Frequency    7X/week      PT Plan      Co-evaluation              AM-PAC PT 6 Clicks Mobility   Outcome Measure  Help needed turning from your back to your side while in a flat bed without using bedrails?: A Little Help needed moving from lying on your back to sitting on the side of a flat bed without using bedrails?: A Little Help needed moving to and from a bed to a chair (including a wheelchair)?: A Little Help needed standing up from a chair using your arms (e.g., wheelchair or bedside chair)?: A Little Help needed to walk in hospital room?: A Little Help  needed climbing 3-5 steps with a railing? : A Little 6 Click Score: 18    End of Session Equipment Utilized During Treatment: Gait belt Activity Tolerance: Patient tolerated treatment well Patient left: in bed;with call bell/phone within reach Nurse Communication: Mobility status PT Visit Diagnosis: Difficulty in walking, not elsewhere classified (R26.2);Pain Pain - Right/Left: Right Pain - part of body: Knee     Time: 8985-8954 PT Time Calculation (min) (ACUTE ONLY): 31 min  Charges:    $Gait Training: 8-22 mins $Therapeutic Exercise: 8-22 mins PT General Charges $$ ACUTE PT VISIT: 1 Visit                    Tari KLEIN, DPT Physical Therapist Acute Rehabilitation Services Office: [phone]    Tari CROME [place] 02/21/2024, 11:53 AM

## 2024-02-21 NOTE — TOC Transition Note (Signed)
 Transition of Care [facility]) - Discharge Note   Patient Details  Name: Robert Haney MRN: [id number] Date of Birth: [birth year]  Transition of Care [facility]) CM/SW Contact:  Doneta Glenys DASEN, RN Phone Number: 02/21/2024, 11:22 AM   Clinical Narrative:    Patient discharging with [facility] for [facility] - PT.  IP CM signing off     Barriers to Discharge: Barriers Resolved   Patient Goals and CMS Choice Patient states their goals for this hospitalization and ongoing recovery are:: Home with [facility] CMS Medicare.gov Compare Post Acute Care list provided to:: Patient Choice offered to / list presented to : Patient [place] ownership interest in [facility]provided to:: Patient    Discharge Placement                       Discharge Plan and Services Additional resources added to the After Visit Summary for   In-house Referral: NA Discharge Planning Services: CM Consult            DME Arranged: N/A         HH Arranged: PT [facility] Date HH Agency Contacted: 02/21/24 Time HH Agency Contacted: 1100 Representative spoke with at [facility] Agency: [facility]  Social Drivers of Health (SDOH) Interventions SDOH Screenings   Food Insecurity: No Food Insecurity (02/19/2024)  Housing: Low Risk (02/19/2024)  Transportation Needs: No Transportation Needs (02/19/2024)  Utilities: Not At Risk (02/19/2024)  Tobacco Use: Low Risk (02/19/2024)     Readmission Risk Interventions     No data to display

## 2024-02-21 NOTE — Progress Notes (Signed)
 Subjective: 2 Days Post-Op Procedures (LRB): ARTHROPLASTY, KNEE, TOTAL (Right) Patient reports pain as moderate.   Wanting to discharge to home. No complaints.  Objective: Vital signs in last 24 hours: Temp:  [98.2 F (36.8 C)-99 F (37.2 C)] 98.7 F (37.1 C) (01/18 0849) Pulse Rate:  [46-62] 52 (01/18 0849) Resp:  [14-20] 14 (01/18 0849) BP: (120-145)/(62-73) 136/66 (01/18 0849) SpO2:  [93 %-100 %] 100 % (01/18 0849) FiO2 (%):  [21 %] 21 % (01/17 2340)  Intake/Output from previous day: 01/17 0701 - 01/18 0700 In: 1442.9 [P.O.:340; I.V.:1102.9] Out: 725 [Urine:475; Emesis/NG output:250] Intake/Output this shift: Total I/O In: 240 [P.O.:240] Out: 600 [Urine:600]  Recent Labs    02/19/24 1225 02/20/24 0533  HGB 15.1 15.0   Recent Labs    02/19/24 1225 02/20/24 0533  WBC 3.4* 10.4  RBC 6.21* 6.21*  HCT 48.2 47.8  PLT 236 237   Recent Labs    02/19/24 1225 02/20/24 0533  NA 138 137  K 4.4 4.8  CL 103 103  CO2 26 24  BUN 16 18  CREATININE 1.96* 2.11*  GLUCOSE 83 153*  CALCIUM 9.2 9.5   No results for input(s): LABPT, INR in the last 72 hours.  Dorsiflexion/Plantar flexion intact Incision: dressing C/D/I Compartment soft   Assessment/Plan: 2 Days Post-Op Procedures (LRB): ARTHROPLASTY, KNEE, TOTAL (Right) Discharge home with home health      Robert Haney 02/21/2024, 12:43 PM

## 2024-02-23 ENCOUNTER — Telehealth: Payer: Self-pay | Admitting: Orthopaedic Surgery

## 2024-02-23 NOTE — Telephone Encounter (Signed)
 Pt's daughter Schuyler called asking for extra bandages for wound. Pt had surgery and changed bandage and they are to [place] daughter. Please call her at [phone].

## 2024-02-23 NOTE — Telephone Encounter (Signed)
 LMOM for patient

## 2024-02-25 ENCOUNTER — Other Ambulatory Visit: Payer: Self-pay | Admitting: *Deleted

## 2024-02-25 DIAGNOSIS — M1711 Unilateral primary osteoarthritis, right knee: Secondary | ICD-10-CM

## 2024-02-25 DIAGNOSIS — Z96651 Presence of right artificial knee joint: Secondary | ICD-10-CM

## 2024-02-26 ENCOUNTER — Encounter: Payer: Self-pay | Admitting: Physical Therapy

## 2024-02-26 ENCOUNTER — Other Ambulatory Visit: Payer: Self-pay

## 2024-02-26 ENCOUNTER — Ambulatory Visit: Admitting: Physical Therapy

## 2024-02-26 DIAGNOSIS — R600 Localized edema: Secondary | ICD-10-CM

## 2024-02-26 DIAGNOSIS — M25561 Pain in right knee: Secondary | ICD-10-CM | POA: Diagnosis not present

## 2024-02-26 DIAGNOSIS — M25661 Stiffness of right knee, not elsewhere classified: Secondary | ICD-10-CM | POA: Diagnosis not present

## 2024-02-26 DIAGNOSIS — R2689 Other abnormalities of gait and mobility: Secondary | ICD-10-CM

## 2024-02-26 DIAGNOSIS — M6281 Muscle weakness (generalized): Secondary | ICD-10-CM

## 2024-02-26 NOTE — Therapy (Signed)
 " OUTPATIENT PHYSICAL THERAPY LOWER EXTREMITY EVALUATION   Patient Name: Robert Haney MRN: [id number] DOB:[birth year], 54 y.o., male Today's Date: 02/26/2024  END OF SESSION:  PT End of Session - 02/26/24 1107     Visit Number 1    Number of Visits 15    Date for Recertification  05/06/24    Authorization Type [facility]    Authorization Time Period 15 visit limit    Authorization - Visit Number 1    Authorization - Number of Visits 15    PT Start Time 1100    PT Stop Time 1140    PT Time Calculation (min) 40 min    Activity Tolerance Patient tolerated treatment well          Past Medical History:  Diagnosis Date   Anxiety    Arthritis    Atrial fibrillation with RVR (HCC)    Chronic kidney disease    Elevated BP    elevated BP readings-2010-------- normalized when rechecked 12/01/08   Elevated LDL cholesterol level     (normal TSH 2008)   Essential hypertension 09/27/2015   GERD (gastroesophageal reflux disease)    Hyperlipidemia 09/27/2015   Hypertension, essential    Leukocytopenia    Mild leukocytopenia- work up including Bone Marrow bx done, normal variant- Dr. gatha [facility] 8/11   Low testosterone    (symptomatic)--2.60 03/2008   Morbid obesity (HCC) 09/27/2015   Murmur 09/27/2015   Overweight    Palpitations 09/27/2015   Pre-diabetes    Vertigo    Past Surgical History:  Procedure Laterality Date   COLONOSCOPY     KNEE SURGERY  02/04/1992   right knee    TOTAL KNEE ARTHROPLASTY Right 02/19/2024   Procedure: ARTHROPLASTY, KNEE, TOTAL;  Surgeon: Vernetta Lonni GRADE, MD;  Location: [facility];  Service: Orthopedics;  Laterality: Right;   WRIST SURGERY  02/04/1991   left wrist    Patient Active Problem List   Diagnosis Date Noted   Status post total right knee replacement 02/19/2024   Unilateral primary osteoarthritis, right knee 12/28/2023   Kidney disease 09/23/2022   Paroxysmal atrial fibrillation (HCC) 10/22/2020   Palpitations  09/27/2015   Essential hypertension 09/27/2015   Murmur 09/27/2015   Hyperlipidemia 09/27/2015   Morbid obesity (HCC) 09/27/2015    PCP: [facility]  REFERRING PROVIDER: Vernetta Lonni GRADE, MD  REFERRING DIAG:  [phone] (ICD-10-CM) - Status post total right knee replacement  M17.11 (ICD-10-CM) - Unilateral primary osteoarthritis, right knee    THERAPY DIAG:  Acute pain of right knee  Stiffness of right knee, not elsewhere classified  Localized edema  Muscle weakness (generalized)  Other abnormalities of gait and mobility  Rationale for Evaluation and Treatment: Rehabilitation  ONSET DATE: 02/19/24  SUBJECTIVE:   SUBJECTIVE STATEMENT: More pain in the shin than the knee. Pt states he feels behind with his PT -- did not get HHPT. Only saw PT in the hospital. Had issues with his heart rate.   PERTINENT HISTORY: S/P Right total knee on 02/19/24. Been waiting on [place] auth for HHPT and hasn't come. [facility] auth on chart. Thanks.  PAIN:  Are you having pain? Yes: NPRS scale: 10 Pain location: R anterior shin Pain description: Achy Aggravating factors: standing, walking, bending knee, straightening knee Relieving factors: Ice, pain medication  PRECAUTIONS: None  RED FLAGS: None   WEIGHT BEARING RESTRICTIONS: No  FALLS:  Has patient fallen in last 6 months? No  LIVING ENVIRONMENT: Lives with: lives with their  family Lives in: House/apartment Stairs: 2 steps Has following equipment at home: Vannie - 2 wheeled  OCCUPATION: Not currently working  PLOF: Independent  PATIENT GOALS: Return to normal level of function  NEXT MD VISIT: 03/03/24 Dr. Vernetta  OBJECTIVE:  Note: Objective measures were completed at Evaluation unless otherwise noted.  DIAGNOSTIC FINDINGS: Nothing recent since TKA  PATIENT SURVEYS:  PSFS: THE PATIENT SPECIFIC FUNCTIONAL SCALE  Place score of 0-10 (0 = unable to perform activity and 10 = able to perform activity at the same  level as before injury or problem)  Activity Date: 02/26/24    Standing up 3    2.  Walking by myself 0    3.  Work 0    4.  Sleep 5    5. Bathe by myself 5    Total Score 13/5 = 2.6      Total Score = Sum of activity scores/number of activities  Minimally Detectable Change: 3 points (for single activity); 2 points (for average score)  [facility] (nd). The Patient Specific Functional Scale . Retrieved from Skateoasis.com.[email or web address]   COGNITION: Overall cognitive status: Within functional limits for tasks assessed     SENSATION: N/T occasionally in feet when sitting  EDEMA:  Moderate edema around R knee  MUSCLE LENGTH: Did not assess (see ROM below)  POSTURE: No Significant postural limitations  PALPATION: TTP anterior knee and shin  LOWER EXTREMITY ROM:  Active/Passive ROM Right eval Left eval  Hip flexion    Hip extension    Hip abduction    Hip adduction    Hip internal rotation    Hip external rotation    Knee flexion 52 A 55 P 125 A  Knee extension -23 A -10 P -5 A  Ankle dorsiflexion    Ankle plantarflexion    Ankle inversion    Ankle eversion     (Blank rows = not tested)  LOWER EXTREMITY MMT:  MMT Right eval Left eval  Hip flexion 2+ 4  Hip extension    Hip abduction 3   Hip adduction    Hip internal rotation    Hip external rotation    Knee flexion 2+ 5  Knee extension 2+ 5  Ankle dorsiflexion    Ankle plantarflexion    Ankle inversion    Ankle eversion     (Blank rows = not tested)  LOWER EXTREMITY SPECIAL TESTS:  Did not assess  FUNCTIONAL TESTS:  5 times sit to stand: with UE assist 1'02 Timed up and go (TUG): 28.43 sec  GAIT: Distance walked: Into clinic (at least 50') Assistive device utilized: Walker - 2 wheeled Level of assistance: Modified independence Comments: Step to pattern with R LE leading, maintaining R knee extension throughout gait  TREATMENT DATE: 02/26/24 Therapeutic exercise: See HEP below  Vaso: x10 min, R LE elevated, 34 deg temp, low compression   PATIENT EDUCATION:  Education details: Exam findings, POC, initial HEP Person educated: Patient Education method: Explanation, Demonstration, and Handouts Education comprehension: verbalized understanding, returned demonstration, and needs further education  HOME EXERCISE PROGRAM: Access Code: [facility] URL: [email or web address] Date: 02/26/2024 Prepared by: Robert Haney Robert Haney  Exercises - Supine Quad Set  - 3 x daily - 7 x weekly - 2-3 sets - 10 reps - 3 sec hold - Supine Ankle Pumps  - 3 x daily - 7 x weekly - 2-3 sets - 10 reps - Supine Heel Slide with Strap  - 3 x daily - 7 x weekly - 2-3 sets - 10 reps - Supine Hip Abduction  - 3 x daily - 7 x weekly - 2-3 sets - 10 reps  ASSESSMENT:  CLINICAL IMPRESSION: Patient is a 54 y.o. M who was seen today for physical therapy evaluation and treatment for R TKA 02/19/24. Current rehab has been complicated by a drop in his heart rate after his surgery. Pt only received PT while in the hospital but has not had any HHPT upon return home. Has been trying to do some exercises at home but needed more guidance. Assessment is significant for decreased R LE strength, decreased R knee ROM and poor quad firing affecting transfers, gait and home mobility. Pt will benefit from PT to address these issues and return to PLOF.   OBJECTIVE IMPAIRMENTS: Abnormal gait, decreased activity tolerance, decreased balance, decreased endurance, decreased mobility, difficulty walking, decreased ROM, decreased strength, hypomobility, increased edema, increased fascial restrictions, increased muscle spasms, impaired flexibility, improper body mechanics, postural dysfunction, and pain.   ACTIVITY LIMITATIONS: carrying,  lifting, bending, sitting, standing, squatting, sleeping, stairs, transfers, bed mobility, bathing, toileting, dressing, hygiene/grooming, and locomotion level  PARTICIPATION LIMITATIONS: meal prep, cleaning, laundry, driving, shopping, community activity, occupation, and yard work  PERSONAL FACTORS: Age, Fitness, Past/current experiences, and Time since onset of injury/illness/exacerbation are also affecting patient's functional outcome.   REHAB POTENTIAL: Good  CLINICAL DECISION MAKING: Evolving/moderate complexity  EVALUATION COMPLEXITY: Moderate   GOALS: Goals reviewed with patient? Yes  SHORT TERM GOALS: Target date: 03/25/2024   Pt will be ind with initial HEP Baseline: Goal status: INITIAL  2.  Pt will demo at least 5-90 deg of knee AROM Baseline:  Goal status: INITIAL    LONG TERM GOALS: Target date: 05/06/2024   Pt will be ind with management and progression of HEP Baseline:  Goal status: INITIAL  2.  Pt will be able to amb independently >1000' for community distances Baseline:  Goal status: INITIAL  3.  Pt will have improved 5x STS to </=15 sec without UE support to demo increased functional LE strength Baseline:  Goal status: INITIAL  4.  Pt will have improved TUG to </=13 sec without a/d to demo reduced fall risk Baseline:  Goal status: INITIAL  5.  Pt will have improved PSFS score average to >/=4.6 to demo MCID Baseline:  Goal status: INITIAL  6.  Pt will have improved knee ROM to 0 to 120 deg for stair ascent/descent Baseline:  Goal status: INITIAL   PLAN:  PT FREQUENCY: 1-3x/wk  PT DURATION: 10 weeks  PLANNED INTERVENTIONS: 97164- PT Re-evaluation, 97750- Physical Performance Testing, 97110-Therapeutic exercises, 97530- Therapeutic activity, [id number]- Neuromuscular re-education, 97535- Self Care, 02859- Manual therapy, [id number]- Gait training, [id number]- Aquatic Therapy, H9716- Electrical stimulation (unattended), 97016- Vasopneumatic device, [id number]-  Ultrasound, 02966- Ionotophoresis 4mg /ml Dexamethasone , 79439 (1-2 muscles), 20561 (3+ muscles)- Dry Needling, Patient/Family education, Balance training, Stair training, Taping, Joint mobilization, Spinal mobilization, Cryotherapy, and Moist heat  PLAN FOR NEXT SESSION: Assess response to HEP. Work on knee ROM/mobility. Quad firing and LE strengthening.    Robert Haney Robert Haney, PT, DPT 02/26/2024, 12:05 PM  "

## 2024-03-01 ENCOUNTER — Encounter: Payer: Self-pay | Admitting: Physical Therapy

## 2024-03-01 ENCOUNTER — Telehealth: Payer: Self-pay | Admitting: Physical Therapy

## 2024-03-01 ENCOUNTER — Encounter: Admitting: Physical Therapy

## 2024-03-01 NOTE — Telephone Encounter (Signed)
 No-show for today's PT visit- called and spoke to him, he reports that the [place] is now going to be sending him HHPT instead. We will go ahead and DC him from OP PT at this point to avoid overlapping services, he is welcome to return for ongoing care here with new [place] auth after having completed HHPT POC.  Josette Rough, PT, DPT 03/01/24 11:24 AM

## 2024-03-01 NOTE — Therapy (Signed)
 PHYSICAL THERAPY DISCHARGE SUMMARY  Visits from Start of Care: 1  Current functional level related to goals / functional outcomes: Per pt, the [facility] will now [facility] HHPT for him instead of OP PT and they will be starting on Thursday. DC per clinic policy to avoid overlapping PT services.    Remaining deficits: Unable to assess    Education / Equipment: DC from OP PT since HHPT is starting    Patient agrees to discharge. Patient goals were not met. Patient is being discharged due to starting HHPT.   Josette Rough, PT, DPT 03/01/24 11:27 AM

## 2024-03-03 ENCOUNTER — Ambulatory Visit: Admitting: Orthopaedic Surgery

## 2024-03-03 ENCOUNTER — Encounter

## 2024-03-03 ENCOUNTER — Encounter: Payer: Self-pay | Admitting: Orthopaedic Surgery

## 2024-03-03 DIAGNOSIS — Z96651 Presence of right artificial knee joint: Secondary | ICD-10-CM

## 2024-03-03 MED ORDER — OXYCODONE HCL 5 MG PO TABS: 5.0000 mg | ORAL_TABLET | Freq: Four times a day (QID) | ORAL | 0 refills | Status: AC | PRN

## 2024-03-03 NOTE — Progress Notes (Signed)
 The patient is a 54 year old who is here today at his first postoperative visit status post a right total knee replacement.  His knee arthritis was quite significant.  On exam his right calf is soft.  His knee is swollen to be expected.  Staples are removed and Steri-Strips applied.  He lacks full extension by 3 to 5 degrees but we can only flex him to about 60 degrees.  We reiterated the importance of getting his knee bending and moving multiple times a day and pushing it hard on his own as well as outpatient physical therapy.  We will see him back in a month to see how he is doing overall from a range of motion and mobility standpoint.  We did [facility] in some [facility].

## 2024-03-07 ENCOUNTER — Encounter: Admitting: Physical Therapy

## 2024-03-09 ENCOUNTER — Encounter: Admitting: Physical Therapy

## 2024-03-11 ENCOUNTER — Encounter: Admitting: Physical Therapy

## 2024-03-14 ENCOUNTER — Encounter: Admitting: Physical Therapy

## 2024-03-16 ENCOUNTER — Encounter

## 2024-03-18 ENCOUNTER — Encounter: Admitting: Physical Therapy

## 2024-03-21 ENCOUNTER — Encounter: Admitting: Physical Therapy

## 2024-03-23 ENCOUNTER — Encounter: Admitting: Rehabilitative and Restorative Service Providers"

## 2024-03-23 ENCOUNTER — Encounter: Admitting: Physical Therapy

## 2024-03-25 ENCOUNTER — Encounter: Admitting: Physical Therapy

## 2024-03-28 ENCOUNTER — Encounter: Admitting: Physical Therapy

## 2024-03-29 ENCOUNTER — Encounter: Admitting: Physical Therapy

## 2024-03-30 ENCOUNTER — Encounter: Admitting: Physical Therapy

## 2024-03-31 ENCOUNTER — Encounter: Admitting: Rehabilitative and Restorative Service Providers"

## 2024-03-31 ENCOUNTER — Encounter: Admitting: Orthopaedic Surgery

## 2024-04-01 ENCOUNTER — Encounter: Admitting: Physical Therapy
